# Patient Record
Sex: Male | Born: 1965 | Race: White | Hispanic: No | Marital: Single | Smoking: Former smoker
Health system: Southern US, Community
[De-identification: ages and names within clinical notes are randomized; demographics above are authoritative.]

## PROBLEM LIST (undated history)

## (undated) DIAGNOSIS — I10 Essential (primary) hypertension: Secondary | ICD-10-CM

## (undated) HISTORY — PX: NASAL SEPTUM SURGERY: SHX37

## (undated) HISTORY — PX: HERNIA REPAIR: SHX51

---

## 2014-01-28 ENCOUNTER — Encounter (HOSPITAL_COMMUNITY): Payer: Self-pay | Admitting: Emergency Medicine

## 2014-01-28 ENCOUNTER — Emergency Department (HOSPITAL_COMMUNITY)

## 2014-01-28 ENCOUNTER — Emergency Department (HOSPITAL_COMMUNITY)
Admission: EM | Admit: 2014-01-28 | Discharge: 2014-01-28 | Disposition: A | Discharge status: Home / Self Care | Attending: Emergency Medicine | Admitting: Emergency Medicine

## 2014-01-28 DIAGNOSIS — Y9389 Activity, other specified: Secondary | ICD-10-CM | POA: Insufficient documentation

## 2014-01-28 DIAGNOSIS — S8990XA Unspecified injury of unspecified lower leg, initial encounter: Secondary | ICD-10-CM | POA: Diagnosis present

## 2014-01-28 DIAGNOSIS — I10 Essential (primary) hypertension: Secondary | ICD-10-CM | POA: Insufficient documentation

## 2014-01-28 DIAGNOSIS — S99919A Unspecified injury of unspecified ankle, initial encounter: Secondary | ICD-10-CM | POA: Diagnosis present

## 2014-01-28 DIAGNOSIS — Y929 Unspecified place or not applicable: Secondary | ICD-10-CM | POA: Insufficient documentation

## 2014-01-28 DIAGNOSIS — X500XXA Overexertion from strenuous movement or load, initial encounter: Secondary | ICD-10-CM | POA: Insufficient documentation

## 2014-01-28 DIAGNOSIS — S93409A Sprain of unspecified ligament of unspecified ankle, initial encounter: Secondary | ICD-10-CM | POA: Insufficient documentation

## 2014-01-28 HISTORY — DX: Essential (primary) hypertension: I10

## 2014-01-28 MED ORDER — TRAMADOL HCL 50 MG PO TABS: 50.0000 mg | ORAL_TABLET | Freq: Four times a day (QID) | ORAL | Status: DC | PRN

## 2014-01-28 NOTE — ED Provider Notes (Signed)
CSN: [id number]     Arrival date & time 01/28/14  1230 History  This chart was scribed for non-physician practitioner, Kerrie Buffalo, FNP,working with Donnetta Hutching, MD, by Karle Plumber, ED Scribe.  This patient was seen in room APFT21/APFT21 and the patient's care was started at 1:29 PM.  Chief Complaint  Patient presents with  . Ankle Pain   The history is provided by the patient. No language interpreter was used.   HPI Comments:  Peter Graham is a 48 y.o. male with h/o HTN who presents from prison to the Emergency Department complaining of moderate left ankle pain that started approximately one week ago. He states the pain started in his lower left leg but now is localized to the ankle. Pt states he feels like a rubber band has "popped" in his lower left leg on two different occasions recently. He reports associated swelling. He denies injury, fall, or trauma to the ankle. He denies ever injuring the left ankle before. He is ambulatory without issue.  Past Medical History  Diagnosis Date  . Hypertension    Past Surgical History  Procedure Laterality Date  . Hernia repair    . Nasal septum surgery     No family history on file. History  Substance Use Topics  . Smoking status: Never Smoker   . Smokeless tobacco: Not on file  . Alcohol Use: No    Review of Systems  Musculoskeletal: Positive for arthralgias (left ankle).  All other systems reviewed and are negative.   Allergies  Review of patient's allergies indicates no known allergies.  Home Medications   Prior to Admission medications   Medication Sig Start Date End Date Taking? Authorizing Provider  traMADol (ULTRAM) 50 MG tablet Take 1 tablet (50 mg total) by mouth every 6 (six) hours as needed. 01/28/14   Hope Orlene Och, NP   Triage Vitals: BP 155/88  Pulse 70  Temp(Src) 98.2 F (36.8 C)  Resp 20  Ht 6\' 1"  (1.854 m)  Wt 205 lb (92.987 kg)  BMI 27.05 kg/m2  SpO2 97% Physical Exam  Nursing note and vitals  reviewed. Constitutional: He is oriented to person, place, and time. He appears well-developed and well-nourished.  Eyes: EOM are normal.  Neck: Neck supple.  Cardiovascular:  Strong pedal pulses. Adequate circulation.  Pulmonary/Chest: Effort normal.  Abdominal: Soft. There is no tenderness.  Musculoskeletal: Normal range of motion.  Neurological: He is alert and oriented to person, place, and time. No cranial nerve deficit.  Good touch sensation.   Skin: Skin is warm and dry.  Pea-sized healing abrasion noted to LLE.  No swelling noted to left ankle. Pain over lateral aspect of left ankle. Pain with inversion and eversion. Normal achilles tendon. Ecchymosis noted to lateral aspect of left ankle. Lateral aspect of left foot large area of ecchymosis. Good strength of BLE.    ED Course  Procedures (including critical care time) DIAGNOSTIC STUDIES: Oxygen Saturation is 97% on RA, normal by my interpretation.   COORDINATION OF CARE: 1:36 PM- Will provide ankle brace. Advised pt to RICE ankle and will prescribe Tramadol. Pt states he has Ibuprofen 800 mg and advised him to continue taking that as well. Pt verbalizes understanding and agrees to plan.  Medications - No data to display Imaging Review Dg Ankle Complete Left  01/28/2014   CLINICAL DATA:  Lateral left ankle pain for 1 week, no known injury  EXAM: LEFT ANKLE COMPLETE - 3+ VIEW  COMPARISON:  None.  FINDINGS:  There is no evidence of fracture, dislocation, or joint effusion. 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 Soft tissues are unremarkable.  IMPRESSION: No acute findings   Electronically Signed   By: Esperanza Heiraymond  Rubner M.D.   On: 01/28/2014 13:22    MDM  48 y.o. male with pain in the left ankle x one week that has increased. Patient stable for discharge without neurovascular compromise. Will treat with pain medication, ice, elevation and ankle brace. Discussed with the patient and all questioned fully answered. He will return if any  problems arise.   I personally performed the services described in this documentation, which was scribed in my presence. The recorded information has been reviewed and is accurate.    [facility] Orlene OchM Neese, [place]NP 01/28/14 [phone]

## 2014-01-28 NOTE — Discharge Instructions (Signed)
Continue your ibuprofen and take the narcotic pain medication as directed. Wear the splint for support and comfort, apply ice, elevate and if symptoms worsen, follow up with Dr. Romeo Apple.

## 2014-01-28 NOTE — ED Notes (Signed)
Pt incarcerated at [facility] [employer] and c/o pain in left ankle approx 1 week ago.  Says denies any injury but ankle has popped twice.

## 2014-06-05 DIAGNOSIS — B009 Herpesviral infection, unspecified: Secondary | ICD-10-CM | POA: Insufficient documentation

## 2014-06-05 DIAGNOSIS — N400 Enlarged prostate without lower urinary tract symptoms: Secondary | ICD-10-CM | POA: Insufficient documentation

## 2014-06-05 DIAGNOSIS — N4821 Abscess of corpus cavernosum and penis: Secondary | ICD-10-CM | POA: Insufficient documentation

## 2014-06-05 DIAGNOSIS — I10 Essential (primary) hypertension: Secondary | ICD-10-CM | POA: Insufficient documentation

## 2014-06-05 DIAGNOSIS — N4822 Cellulitis of corpus cavernosum and penis: Secondary | ICD-10-CM | POA: Insufficient documentation

## 2014-06-09 DIAGNOSIS — L0390 Cellulitis, unspecified: Secondary | ICD-10-CM | POA: Insufficient documentation

## 2014-08-19 ENCOUNTER — Encounter (HOSPITAL_COMMUNITY): Payer: Self-pay | Admitting: Emergency Medicine

## 2014-08-19 ENCOUNTER — Emergency Department (HOSPITAL_COMMUNITY)
Admission: EM | Admit: 2014-08-19 | Discharge: 2014-08-19 | Disposition: A | Discharge status: Home / Self Care | Attending: Emergency Medicine | Admitting: Emergency Medicine

## 2014-08-19 DIAGNOSIS — L02415 Cutaneous abscess of right lower limb: Secondary | ICD-10-CM | POA: Diagnosis not present

## 2014-08-19 DIAGNOSIS — I10 Essential (primary) hypertension: Secondary | ICD-10-CM | POA: Insufficient documentation

## 2014-08-19 LAB — CBC WITH DIFFERENTIAL/PLATELET
BASOS PCT: 1 % (ref 0–1)
Basophils Absolute: 0 10*3/uL (ref 0.0–0.1)
Eosinophils Absolute: 0.2 10*3/uL (ref 0.0–0.7)
Eosinophils Relative: 4 % (ref 0–5)
HCT: 36.9 % — ABNORMAL LOW (ref 39.0–52.0)
HEMOGLOBIN: 12.8 g/dL — AB (ref 13.0–17.0)
Lymphocytes Relative: 36 % (ref 12–46)
Lymphs Abs: 1.8 10*3/uL (ref 0.7–4.0)
MCH: 32.9 pg (ref 26.0–34.0)
MCHC: 34.7 g/dL (ref 30.0–36.0)
MCV: 94.9 fL (ref 78.0–100.0)
MONOS PCT: 8 % (ref 3–12)
Monocytes Absolute: 0.4 10*3/uL (ref 0.1–1.0)
NEUTROS ABS: 2.6 10*3/uL (ref 1.7–7.7)
Neutrophils Relative %: 51 % (ref 43–77)
Platelets: 200 10*3/uL (ref 150–400)
RBC: 3.89 MIL/uL — ABNORMAL LOW (ref 4.22–5.81)
RDW: 12.8 % (ref 11.5–15.5)
WBC: 5 10*3/uL (ref 4.0–10.5)

## 2014-08-19 LAB — BASIC METABOLIC PANEL
Anion gap: 15 (ref 5–15)
BUN: 15 mg/dL (ref 6–23)
CHLORIDE: 102 meq/L (ref 96–112)
CO2: 22 mEq/L (ref 19–32)
Calcium: 9.3 mg/dL (ref 8.4–10.5)
Creatinine, Ser: 1.14 mg/dL (ref 0.50–1.35)
GFR calc non Af Amer: 74 mL/min — ABNORMAL LOW (ref >90)
GFR, EST AFRICAN AMERICAN: 86 mL/min — AB (ref >90)
Glucose, Bld: 92 mg/dL (ref 70–99)
POTASSIUM: 4 meq/L (ref 3.7–5.3)
Sodium: 139 mEq/L (ref 137–147)

## 2014-08-19 MED ORDER — LIDOCAINE-EPINEPHRINE (PF) 2 %-1:200000 IJ SOLN
10.0000 mL | Freq: Once | INTRAMUSCULAR | Status: DC
  Filled 2014-08-19: qty 20

## 2014-08-19 NOTE — ED Notes (Signed)
Patient is a [facility]  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  Patient c/o wound to right inner thigh and has been on Cleocin and Septra x 1 week.  Patient states medication is not working.  Patient c/o burning pain to site.

## 2014-08-19 NOTE — ED Provider Notes (Signed)
CSN: [id number]     Arrival date & time 08/19/14  0039 History   First MD Initiated Contact with Patient 08/19/14 0154     No chief complaint on file.    (Consider location/radiation/quality/duration/timing/severity/associated sxs/prior Treatment) HPI  Patient reports he started getting a "spot" on his right thigh about 9 days ago. States he's had MRSA infections at least 3 times in the last several months. He was last admitted about 6 weeks ago for a change in antibiotics however he does not know what they used. He denies any fever. He states he's feeling "weird" which means he is having hot flashes. He denies any draining. He states his scar starting to get increased redness. It started getting a black center 2-3 days ago. He has had nausea without vomiting. He reports he has been taking Cleocin and Septra for the past week. He denies any exposure to spiders.  PCP Prison Physician  Past Medical History  Diagnosis Date  . Hypertension    prostate problems History of abscesses   Past Surgical History  Procedure Laterality Date  . Hernia repair    . Nasal septum surgery     No family history on file. History  Substance Use Topics  . Smoking status: Never Smoker   . Smokeless tobacco: Not on file  . Alcohol Use: No   patient has been incarcerated for 9 years  Review of Systems  All other systems reviewed and are negative.     Allergies  Review of patient's allergies indicates no known allergies.  Home Medications   Prior to Admission medications   Medication Sig Start Date End Date Taking? Authorizing Provider  traMADol (ULTRAM) 50 MG tablet Take 1 tablet (50 mg total) by mouth every 6 (six) hours as needed. 01/28/14   Hope Orlene OchM Neese, NP   Vasotec Atenolol Zantac Cardura Cleocin Septra   BP 137/95 mmHg  Pulse 54  Temp(Src) 97.6 F (36.4 C) (Oral)  Resp 18  Ht 6\' 1"  (1.854 m)  Wt 210 lb (95.255 kg)  BMI 27.71 kg/m2  SpO2 100%  Vital signs normal except for  bradycardia  Physical Exam  Constitutional: He is oriented to person, place, and time. He appears well-developed and well-nourished.  Non-toxic appearance. He does not appear ill. No distress.  HENT:  Head: Normocephalic and atraumatic.  Right Ear: External ear normal.  Left Ear: External ear normal.  Nose: Nose normal. No mucosal edema or rhinorrhea.  Mouth/Throat: Mucous membranes are normal. No dental abscesses or uvula swelling.  Eyes: Conjunctivae and EOM are normal. Pupils are equal, round, and reactive to light.  Neck: Normal range of motion and full passive range of motion without pain. Neck supple.  Pulmonary/Chest: Effort normal. No respiratory distress. He has no rhonchi. He exhibits no crepitus.  Abdominal: Normal appearance.  Musculoskeletal: Normal range of motion. He exhibits edema and tenderness.  Moves all extremities well.  Patient has a roughly 5 cm area of redness surrounding a black raised area on his proximal medial right thigh that is consistent with an abscess/spider bite.  Neurological: He is alert and oriented to person, place, and time. He has normal strength. No cranial nerve deficit.  Skin: Skin is warm, dry and intact. No rash noted. No erythema. No pallor.  Psychiatric: He has a normal mood and affect. His speech is normal and behavior is normal. His mood appears not anxious.  Nursing note and vitals reviewed.  ED Course  Procedures (including critical care time)   INCISION AND DRAINAGE Performed by: Devoria AlbeKNAPP,Redell Nazir L Consent: Verbal consent obtained. Risks and benefits: risks, benefits and alternatives were discussed Type: abscess  Body area: Medial right thigh  Anesthesia: local infiltration  Incision was made with a 11 scalpel.  Local anesthetic: lidocaine 1%+ 1%  epinephrine  Anesthetic total: 2 ml  Complexity: complex Blunt dissection to break up loculations  Drainage: purulent  Drainage amount: small, the black necrotic  center was removed   Packing material: 1/4 in iodoform gauze  Patient tolerance: Patient tolerated the procedure well with no immediate complications.    Labs Review Results for orders placed or performed during the hospital encounter of 08/19/14  CBC with Differential  Result Value Ref Range   WBC 5.0 4.0 - 10.5 K/uL   RBC 3.89 (L) 4.22 - 5.81 MIL/uL   Hemoglobin 12.8 (L) 13.0 - 17.0 g/dL   HCT 16.136.9 (L) 09.639.0 - 04.552.0 %   MCV 94.9 78.0 - 100.0 fL   MCH 32.9 26.0 - 34.0 pg   MCHC 34.7 30.0 - 36.0 g/dL   RDW 40.912.8 81.111.5 - 91.415.5 %   Platelets 200 150 - 400 K/uL   Neutrophils Relative % 51 43 - 77 %   Neutro Abs 2.6 1.7 - 7.7 K/uL   Lymphocytes Relative 36 12 - 46 %   Lymphs Abs 1.8 0.7 - 4.0 K/uL   Monocytes Relative 8 3 - 12 %   Monocytes Absolute 0.4 0.1 - 1.0 K/uL   Eosinophils Relative 4 0 - 5 %   Eosinophils Absolute 0.2 0.0 - 0.7 K/uL   Basophils Relative 1 0 - 1 %   Basophils Absolute 0.0 0.0 - 0.1 K/uL  Basic metabolic panel  Result Value Ref Range   Sodium 139 137 - 147 mEq/L   Potassium 4.0 3.7 - 5.3 mEq/L   Chloride 102 96 - 112 mEq/L   CO2 22 19 - 32 mEq/L   Glucose, Bld 92 70 - 99 mg/dL   BUN 15 6 - 23 mg/dL   Creatinine, Ser 7.821.14 0.50 - 1.35 mg/dL   Calcium 9.3 8.4 - 95.610.5 mg/dL   GFR calc non Af Amer 74 (L) >90 mL/min   GFR calc Af Amer 86 (L) >90 mL/min   Anion gap 15 5 - 15   No results found.    Imaging Review No results found.   EKG Interpretation None      MDM   patient presents with an abscess on his right medial thigh. He is on appropriate antibiotics. The area was I and D'd which should help his symptoms improved. The black necrotic center makes me suspicious this may have been a spider bite. However the treatment would be the same. Patient has a normal white blood cell count and he is afebrile. At this point I do not feel like he needs admission for IV antibiotics.    Final diagnoses:  Abscess of right thigh    Plan discharge  Devoria AlbeIva Jamiracle Avants,  MD, Franz DellFACEP     Jerret Mcbane L Valentino Saavedra, MD 08/19/14 [phone]

## 2014-08-19 NOTE — Discharge Instructions (Signed)
Use warm compresses on the area. He can have ibuprofen 600 mg + acetaminophen 1000 mg 4 times a day for pain if needed. He needs to stay on the antibiotics for another 7-10 days. The packing may fall out and that is okay, if it doesn't on it's own it can be removed in 2 days. Your blood tests today are normal with a normal WBC count. Recheck if you get a fever, or the redness spreads dramatically over the next 24-48 hrs. I would expect it to have some mild increase of the redness.

## 2014-08-19 NOTE — ED Notes (Signed)
Report given to Jasmine DecemberSharon, RN triage nurse at corrections facility

## 2014-08-22 ENCOUNTER — Telehealth (HOSPITAL_BASED_OUTPATIENT_CLINIC_OR_DEPARTMENT_OTHER): Payer: Self-pay | Admitting: Emergency Medicine

## 2014-08-22 LAB — WOUND CULTURE

## 2014-08-22 NOTE — Telephone Encounter (Signed)
Pt for MRSA, being treated for MRSA when she visited the ED, pt is also a prisoner therefore not called, pt continuing antibiotics

## 2015-02-06 ENCOUNTER — Emergency Department (HOSPITAL_COMMUNITY)
Admission: EM | Admit: 2015-02-06 | Discharge: 2015-02-06 | Disposition: A | Discharge status: Home / Self Care | Attending: Emergency Medicine | Admitting: Emergency Medicine

## 2015-02-06 ENCOUNTER — Encounter (HOSPITAL_COMMUNITY): Payer: Self-pay | Admitting: Emergency Medicine

## 2015-02-06 DIAGNOSIS — I10 Essential (primary) hypertension: Secondary | ICD-10-CM | POA: Diagnosis not present

## 2015-02-06 DIAGNOSIS — R21 Rash and other nonspecific skin eruption: Secondary | ICD-10-CM | POA: Diagnosis present

## 2015-02-06 DIAGNOSIS — J340 Abscess, furuncle and carbuncle of nose: Secondary | ICD-10-CM | POA: Diagnosis not present

## 2015-02-06 DIAGNOSIS — Z79899 Other long term (current) drug therapy: Secondary | ICD-10-CM | POA: Diagnosis not present

## 2015-02-06 MED ORDER — HYDROMORPHONE HCL 1 MG/ML IJ SOLN
1.0000 mg | Freq: Once | INTRAMUSCULAR | Status: AC
  Administered 2015-02-06: 1 mg via INTRAVENOUS
  Filled 2015-02-06: qty 1

## 2015-02-06 MED ORDER — DOXYCYCLINE HYCLATE 100 MG PO CAPS: 100.0000 mg | ORAL_CAPSULE | Freq: Two times a day (BID) | ORAL | Status: DC

## 2015-02-06 MED ORDER — VANCOMYCIN HCL IN DEXTROSE 1-5 GM/200ML-% IV SOLN
1000.0000 mg | Freq: Once | INTRAVENOUS | Status: AC
  Administered 2015-02-06: 1000 mg via INTRAVENOUS
  Filled 2015-02-06: qty 200

## 2015-02-06 MED ORDER — LIDOCAINE-EPINEPHRINE (PF) 2 %-1:200000 IJ SOLN
10.0000 mL | Freq: Once | INTRAMUSCULAR | Status: AC
  Administered 2015-02-06: 10 mL
  Filled 2015-02-06: qty 20

## 2015-02-06 MED ORDER — OXYCODONE-ACETAMINOPHEN 5-325 MG PO TABS: 1.0000 | ORAL_TABLET | ORAL | Status: DC | PRN

## 2015-02-06 MED ORDER — MUPIROCIN CALCIUM 2 % NA OINT: TOPICAL_OINTMENT | NASAL | Status: DC

## 2015-02-06 NOTE — Discharge Instructions (Signed)
Abscess °An abscess is an infected area that contains a collection of pus and debris. It can occur in almost any part of the body. An abscess is also known as a furuncle or boil. °CAUSES  °An abscess occurs when tissue gets infected. This can occur from blockage of oil or sweat glands, infection of hair follicles, or a minor injury to the skin. As the body tries to fight the infection, pus collects in the area and creates pressure under the skin. This pressure causes pain. People with weakened immune systems have difficulty fighting infections and get certain abscesses more often.  °SYMPTOMS °Usually an abscess develops on the skin and becomes a painful mass that is red, warm, and tender. If the abscess forms under the skin, you may feel a moveable soft area under the skin. Some abscesses break open (rupture) on their own, but most will continue to get worse without care. The infection can spread deeper into the body and eventually into the bloodstream, causing you to feel ill.  °DIAGNOSIS  °Your caregiver will take your medical history and perform a physical exam. A sample of fluid may also be taken from the abscess to determine what is causing your infection. °TREATMENT  °Your caregiver may prescribe antibiotic medicines to fight the infection. However, taking antibiotics alone usually does not cure an abscess. Your caregiver may need to make a small cut (incision) in the abscess to drain the pus. In some cases, gauze is packed into the abscess to reduce pain and to continue draining the area. °HOME CARE INSTRUCTIONS  °· Only take over-the-counter or prescription medicines for pain, discomfort, or fever as directed by your caregiver. °· If you were prescribed antibiotics, take them as directed. Finish them even if you start to feel better. °· If gauze is used, follow your caregiver's directions for changing the gauze. °· To avoid spreading the infection: °· Keep your draining abscess covered with a  bandage. °· Wash your hands well. °· Do not share personal care items, towels, or whirlpools with others. °· Avoid skin contact with others. °· Keep your skin and clothes clean around the abscess. °· Keep all follow-up appointments as directed by your caregiver. °SEEK MEDICAL CARE IF:  °· You have increased pain, swelling, redness, fluid drainage, or bleeding. °· You have muscle aches, chills, or a general ill feeling. °· You have a fever. °MAKE SURE YOU:  °· Understand these instructions. °· Will watch your condition. °· Will get help right away if you are not doing well or get worse. °Document Released: 05/27/2005 Document Revised: 02/16/2012 Document Reviewed: 10/30/2011 °ExitCare® Patient Information ©2015 [employer]. This information is not intended to replace advice given to you by your health care provider. Make sure you discuss any questions you have with your health care provider. ° °Facial Infection °You have an infection of your face. This requires special attention to help prevent serious problems. Infections in facial wounds can cause poor healing and scars. They can also spread to deeper tissues, especially around the eye. Wound and dental infections can lead to sinusitis, infection of the eye socket, and even meningitis. Permanent damage to the skin, eye, and nervous system may result if facial infections are not treated properly. With severe infections, hospital care for IV antibiotic injections may be needed if they don't respond to oral antibiotics. °Antibiotics must be taken for the full course to insure the infection is eliminated. If the infection came from a bad tooth, it may have to be   extracted when the infection is under control. Warm compresses may be applied to reduce skin irritation and remove drainage. °You might need a tetanus shot now if: °· You cannot remember when your last tetanus shot was. °· You have never had a tetanus shot. °· The object that caused your wound was dirty. °If  you need a tetanus shot, and you decide not to get one, there is a rare chance of getting tetanus. Sickness from tetanus can be serious. If you got a tetanus shot, your arm may swell, get red and warm to the touch at the shot site. This is common and not a problem. °SEEK IMMEDIATE MEDICAL CARE IF:  °· You have increased swelling, redness, or trouble breathing. °· You have a severe headache, dizziness, nausea, or vomiting. °· You develop problems with your eyesight. °· You have a fever. °Document Released: 09/24/2004 Document Revised: 11/09/2011 Document Reviewed: 08/17/2005 °ExitCare® Patient Information ©2015 [employer]. This information is not intended to replace advice given to you by your health care provider. Make sure you discuss any questions you have with your health care provider. ° °

## 2015-02-06 NOTE — ED Notes (Signed)
Pt alert & oriented x4, stable gait. Patient given discharge instructions, paperwork & prescription(s). Patient  instructed to stop at the registration desk to finish any additional paperwork. Patient verbalized understanding. Pt left department w/ no further questions. 

## 2015-02-06 NOTE — ED Provider Notes (Signed)
CSN: [id number]     Arrival date & time 02/06/15  1478 History   First MD Initiated Contact with Patient 02/06/15 [phone]     Chief Complaint  Patient presents with  . Cyst     (Consider location/radiation/quality/duration/timing/severity/associated sxs/prior Treatment) The history is provided by the patient.   Peter Graham is a 49 y.o. male presenting with a four-day history of abscess to the right side of his nose.  He reports pain and swelling which is now affecting his right eyelid as well.  He has a history of MRSA infection.  He states there was a small amount of drainage from this wound site yesterday.  He endorses nausea which he suspects is secondary to pain, denies fevers or chills or other complaints.  He states that Cleocin in the past has not helped him with his infections, doxycycline has been effective, and has had wound culture performed at [facility] verifying doxycycline as a better anti-biotic for him.  He has taken Tylenol without relief of pain.     Past Medical History  Diagnosis Date  . Hypertension    Past Surgical History  Procedure Laterality Date  . Hernia repair    . Nasal septum surgery     History reviewed. No pertinent family history. History  Substance Use Topics  . Smoking status: Never Smoker   . Smokeless tobacco: Not on file  . Alcohol Use: No    Review of Systems  Constitutional: Negative for fever and chills.  Respiratory: Negative for shortness of breath and wheezing.   Gastrointestinal: Positive for nausea.  Skin: Positive for color change and wound.  Neurological: Negative for numbness.      Allergies  Review of patient's allergies indicates no known allergies.  Home Medications   Prior to Admission medications   Medication Sig Start Date End Date Taking? Authorizing Provider  atenolol (TENORMIN) 50 MG tablet Take 50 mg by mouth daily.    Historical Provider, MD  Doxazosin Mesylate (CARDURA PO) Take 6 mg by mouth at bedtime.     Historical Provider, MD  doxycycline (VIBRAMYCIN) 100 MG capsule Take 1 capsule (100 mg total) by mouth 2 (two) times daily. 02/06/15   Burgess Amor, PA-C  enalapril (VASOTEC) 10 MG tablet Take 10 mg by mouth daily.    Historical Provider, MD  mupirocin nasal ointment (BACTROBAN) 2 % Apply in each nostril daily for 10 days 02/06/15   Burgess Amor, PA-C  oxyCODONE-acetaminophen (PERCOCET/ROXICET) 5-325 MG per tablet Take 1 tablet by mouth every 4 (four) hours as needed. 02/06/15   Burgess Amor, PA-C  ranitidine (ZANTAC) 150 MG tablet Take 150 mg by mouth 2 (two) times daily.    Historical Provider, MD  traMADol (ULTRAM) 50 MG tablet Take 1 tablet (50 mg total) by mouth every 6 (six) hours as needed. 01/28/14   Hope Orlene Och, NP   BP 138/75 mmHg  Pulse 64  Temp(Src) 97.9 F (36.6 C) (Oral)  Resp 18  Ht  (1.854 m)  Wt 205 lb (92.987 kg)  BMI 27.05 kg/m2  SpO2 99% Physical Exam  Constitutional: He appears well-developed and well-nourished. No distress.  HENT:  Head: Normocephalic.  Nose: Sinus tenderness present.    Mouth/Throat: Uvula is midline, oropharynx is clear and moist and mucous membranes are normal.  Raised 1 cm induration with central eschar on right nasal bridge.  There is no spontaneous drainage.  He has slight.  Orbital edema without erythema right eyelid.  Eyes: Conjunctivae and EOM  are normal.  No pain with EOM's.  Neck: Neck supple.  Cardiovascular: Normal rate.   Pulmonary/Chest: Effort normal. He has no wheezes.  Musculoskeletal: Normal range of motion. He exhibits no edema.  Skin: Rash noted.    ED Course  Procedures (including critical care time)  INCISION AND DRAINAGE Performed by: Burgess AmorIDOL, [name] Consent: Verbal consent obtained. Risks and benefits: risks, benefits and alternatives were discussed Type: abscess  Body area: right nasal bridge  Anesthesia: local infiltration  Incision was made with a scalpel.  Local anesthetic: lidocaine 2% with  epinephrine  Anesthetic total: 0.5 ml  Complexity: complex Blunt dissection to break up loculations  Drainage: purulent  Drainage amount: small  Packing material: none  Patient tolerance: Patient tolerated the procedure well with no immediate complications.    Labs Review Labs Reviewed - No data to display  Imaging Review No results found.   EKG Interpretation None      MDM   Final diagnoses:  Abscess of nose    Patient advised to have a nurse recheck tomorrow at his present facility.  He was prescribed doxycycline, oxycodone for pain relief, also prescribed mupirocin nasal ointment.  He was given an IV dose of vancomycin while here to help minimize the risk of infection spreading to the periorbital area.  Patient tolerated the I&D procedure well.    Burgess AmorJulie Myliah Medel, PA-C 02/06/15 1109  Gilda Creasehristopher J Pollina, MD 02/07/15 [phone]

## 2015-02-06 NOTE — ED Notes (Signed)
Pt reports abscess to r side of nose since Saturday.  Reports pain and pressure to area and around eye.  Swelling noted under R eye.  Reports history of MRSA.

## 2015-02-06 NOTE — ED Notes (Addendum)
Pt reports "cyst" to right side of nose. Pt reports history of MRS infections. Pt reports nausea at this time.mild swelling and redness noted to bridge of nose and under right eye. nad noted.

## 2015-07-03 IMAGING — CR DG ANKLE COMPLETE 3+V*L*
3 series · 3 of 3 positions shown · non-contrast
Comparison: None.

CLINICAL DATA: Lateral left ankle pain for 1 week, no known injury

EXAM:
LEFT ANKLE COMPLETE - 3+ VIEW

[view not recorded (1 of 3)]
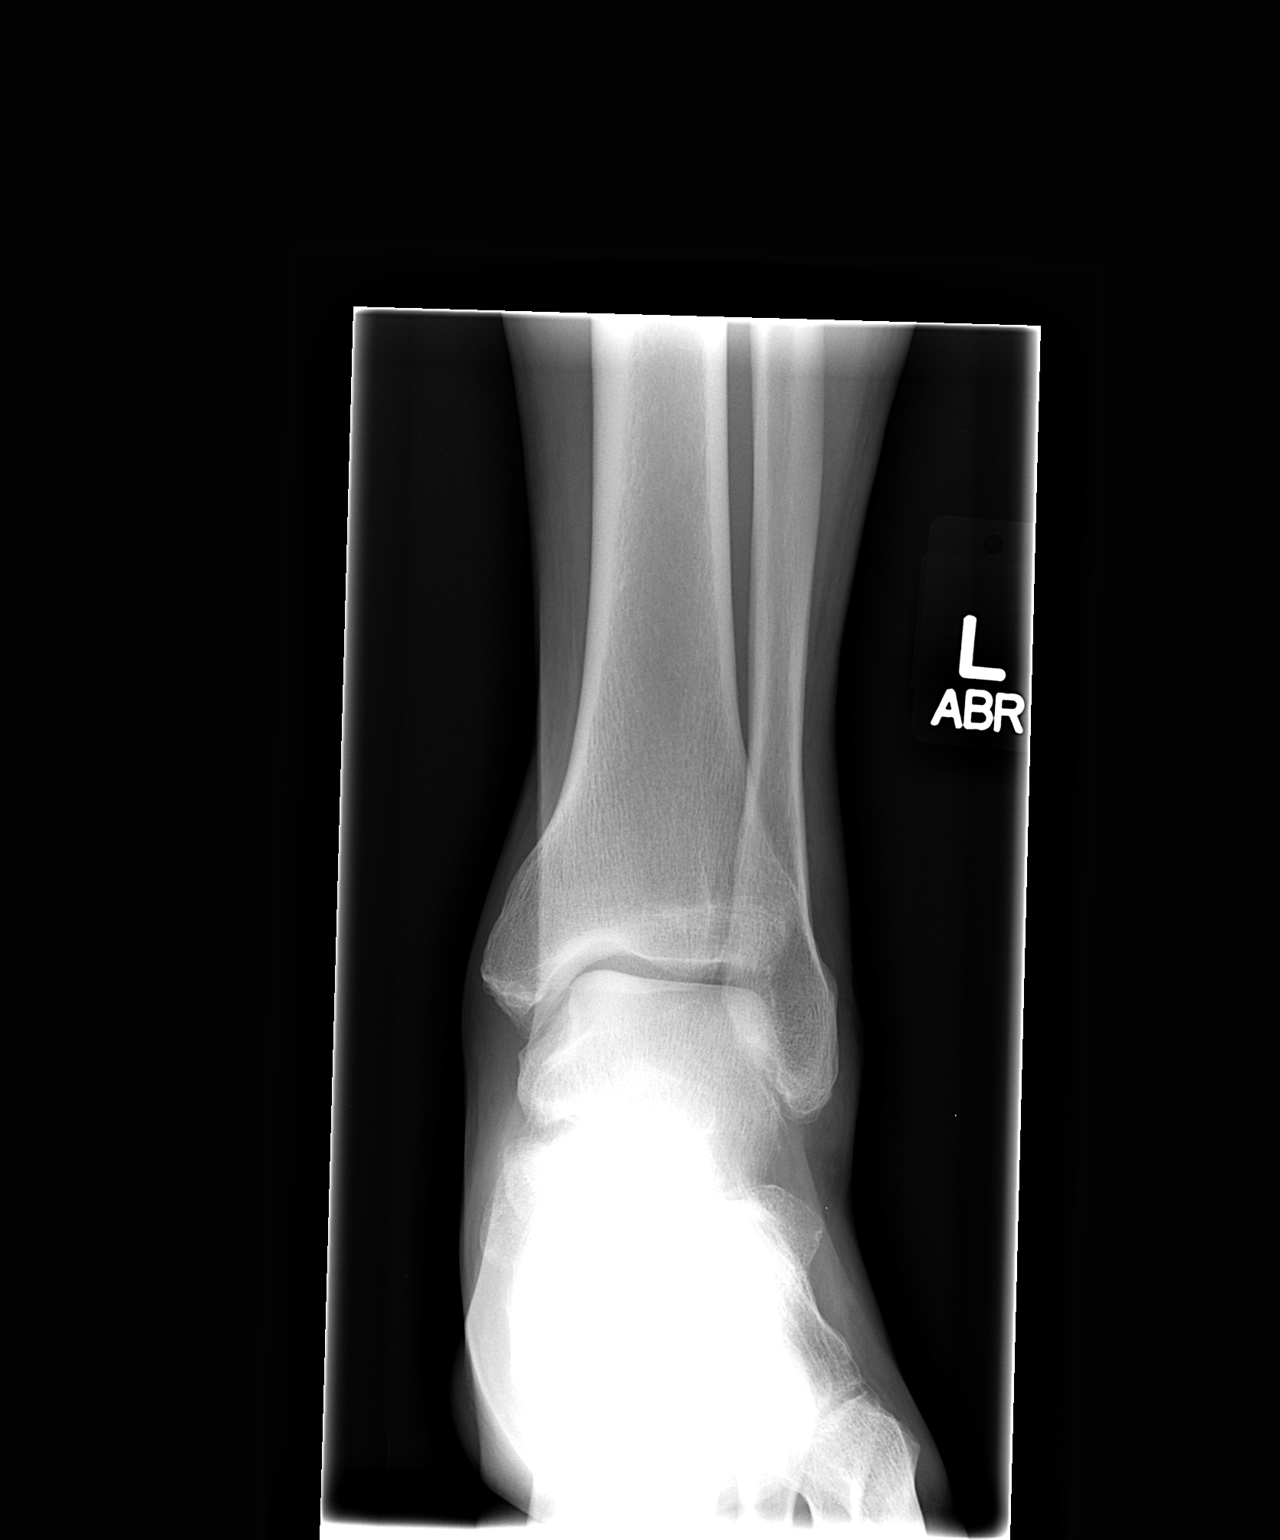

[view not recorded (2 of 3)]
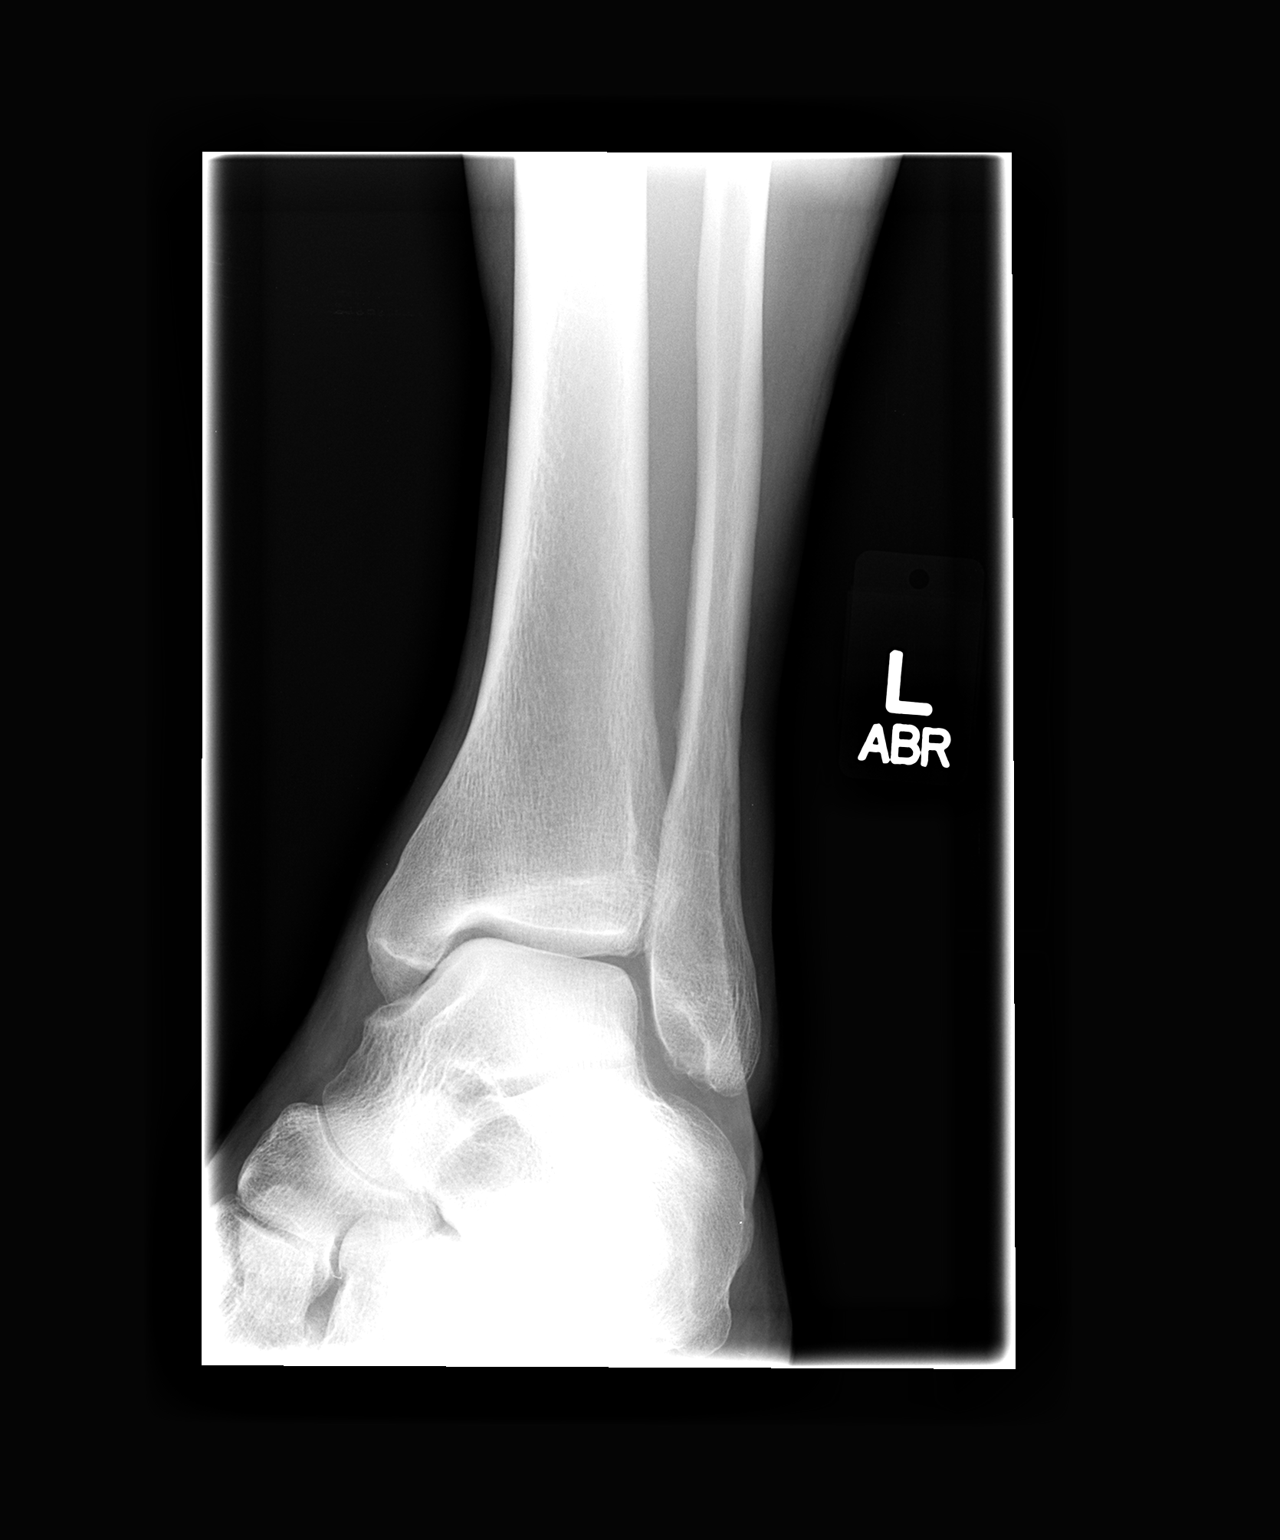

[view not recorded (3 of 3)]
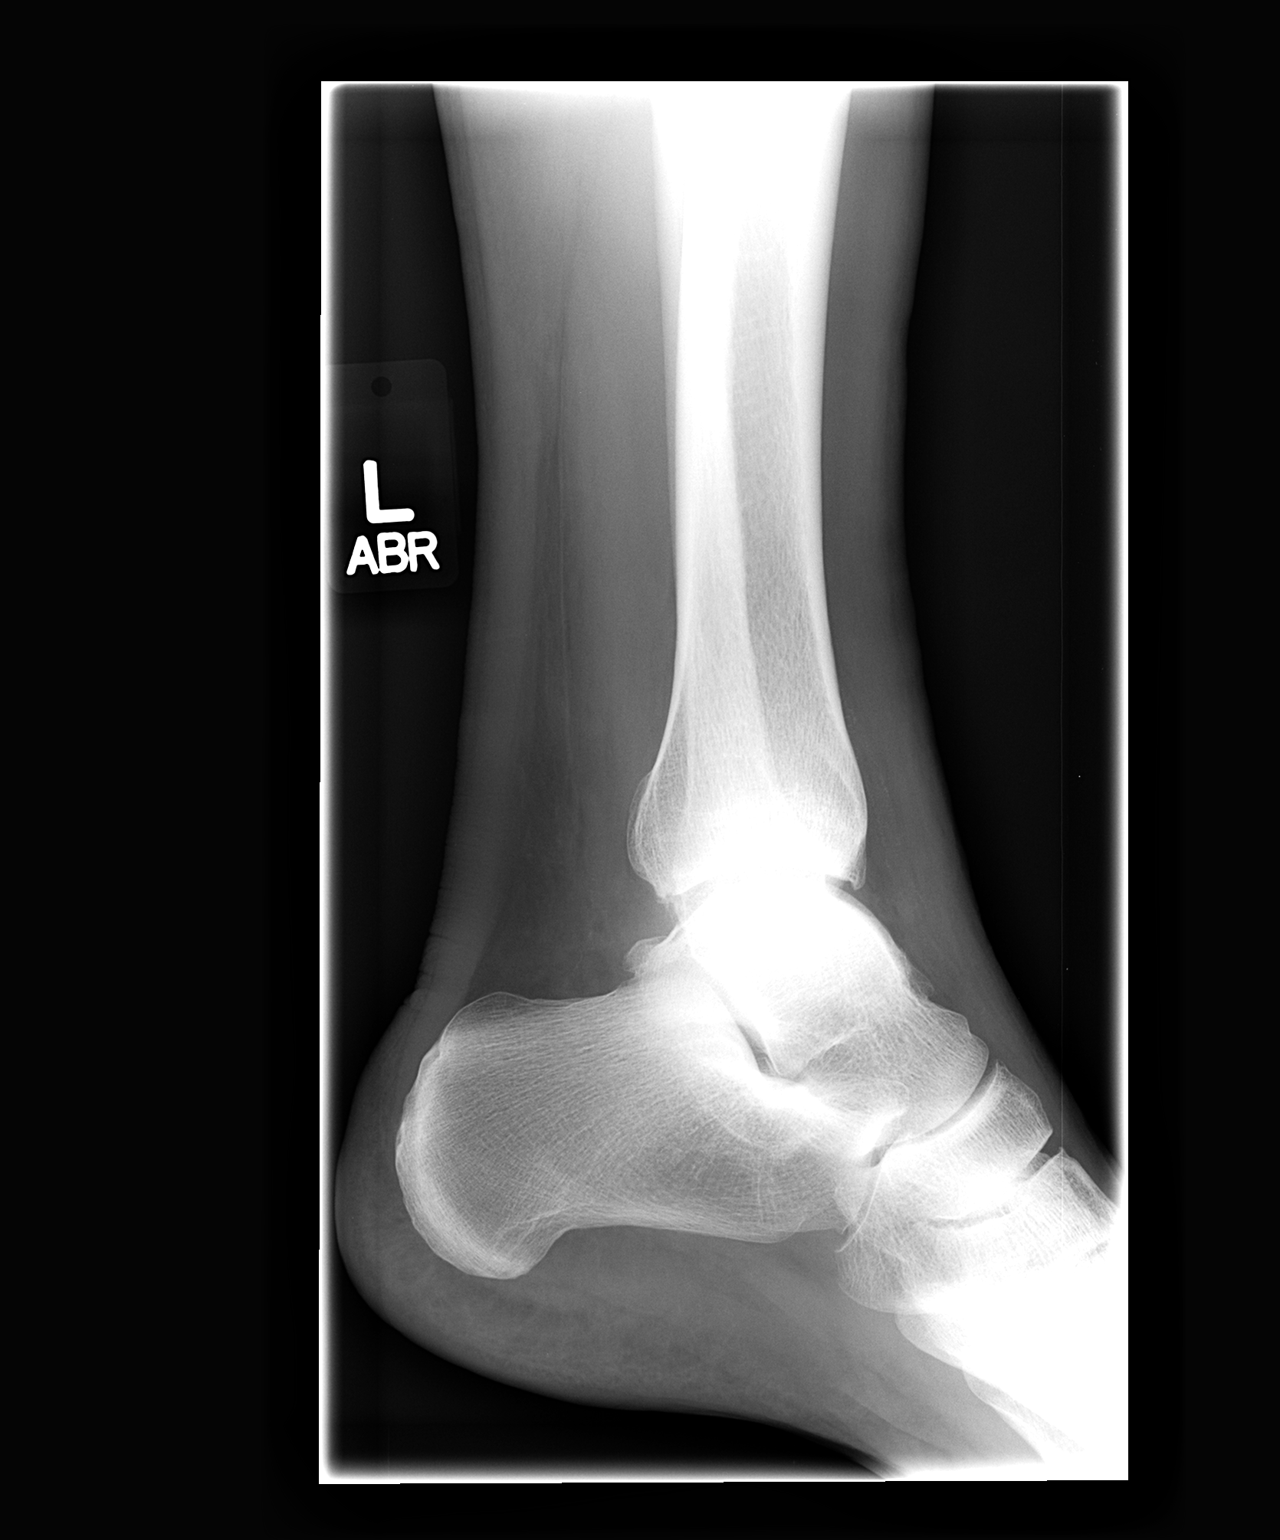

[3 of 3 positions shown; findings below may reference images not displayed]

FINDINGS: There is no evidence of fracture, dislocation, or joint effusion.
There is mild tibiotalar arthritis. Soft tissues are unremarkable.
IMPRESSION: No acute findings

## 2016-02-17 ENCOUNTER — Encounter (HOSPITAL_COMMUNITY): Payer: Self-pay | Admitting: *Deleted

## 2016-02-17 ENCOUNTER — Emergency Department (HOSPITAL_COMMUNITY)
Admission: EM | Admit: 2016-02-17 | Discharge: 2016-02-17 | Disposition: A | Discharge status: Home / Self Care | Attending: Emergency Medicine | Admitting: Emergency Medicine

## 2016-02-17 DIAGNOSIS — L0231 Cutaneous abscess of buttock: Secondary | ICD-10-CM | POA: Diagnosis present

## 2016-02-17 DIAGNOSIS — I10 Essential (primary) hypertension: Secondary | ICD-10-CM | POA: Insufficient documentation

## 2016-02-17 DIAGNOSIS — L03317 Cellulitis of buttock: Secondary | ICD-10-CM | POA: Insufficient documentation

## 2016-02-17 DIAGNOSIS — L0291 Cutaneous abscess, unspecified: Secondary | ICD-10-CM

## 2016-02-17 DIAGNOSIS — Z79899 Other long term (current) drug therapy: Secondary | ICD-10-CM | POA: Insufficient documentation

## 2016-02-17 DIAGNOSIS — L0390 Cellulitis, unspecified: Secondary | ICD-10-CM

## 2016-02-17 MED ORDER — OXYCODONE-ACETAMINOPHEN 5-325 MG PO TABS
1.0000 | ORAL_TABLET | Freq: Once | ORAL | Status: AC
  Administered 2016-02-17: 1 via ORAL
  Filled 2016-02-17: qty 1

## 2016-02-17 MED ORDER — DOXYCYCLINE HYCLATE 100 MG PO CAPS: 100.0000 mg | ORAL_CAPSULE | Freq: Two times a day (BID) | ORAL | Status: DC

## 2016-02-17 MED ORDER — LIDOCAINE-EPINEPHRINE (PF) 2 %-1:200000 IJ SOLN
20.0000 mL | Freq: Once | INTRAMUSCULAR | Status: AC
  Administered 2016-02-17: 20 mL
  Filled 2016-02-17: qty 20

## 2016-02-17 NOTE — ED Notes (Signed)
Report called and given to Molli HazardMatthew at the prison triage

## 2016-02-17 NOTE — Discharge Instructions (Signed)

## 2016-02-17 NOTE — ED Provider Notes (Signed)
CSN: [id number]     Arrival date & time 02/17/16  0109 History   First MD Initiated Contact with Patient 02/17/16 0321     Chief Complaint  Patient presents with  . Abscess     (Consider location/radiation/quality/duration/timing/severity/associated sxs/prior Treatment) HPI  This a 50 year old male with a history of MRSA skin infections who presents with an abscessed left buttock. Noted pain and redness to the left buttock starting on Thursday. No fevers. Reports 10 out of 10 pain when sitting. Has not taking anything for pain. Has not noted any drainage. 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  Past Medical History  Diagnosis Date  . Hypertension    Past Surgical History  Procedure Laterality Date  . Hernia repair    . Nasal septum surgery     No family history on file. Social History  Substance Use Topics  . Smoking status: Never Smoker   . Smokeless tobacco: None  . Alcohol Use: No    Review of Systems  Constitutional: Negative for fever.  Skin: Positive for color change.  All other systems reviewed and are negative.     Allergies  Review of patient's allergies indicates no known allergies.  Home Medications   Prior to Admission medications   Medication Sig Start Date End Date Taking? Authorizing Provider  Doxazosin Mesylate (CARDURA PO) Take 6 mg by mouth at bedtime.   Yes Historical Provider, MD  enalapril (VASOTEC) 10 MG tablet Take 10 mg by mouth daily.   Yes Historical Provider, MD  ibuprofen (ADVIL,MOTRIN) 800 MG tablet Take 800 mg by mouth daily as needed for moderate pain.   Yes Historical Provider, MD  omeprazole (PRILOSEC) 20 MG capsule Take 20 mg by mouth daily.   Yes Historical Provider, MD  atenolol (TENORMIN) 50 MG tablet Take 50 mg by mouth daily.    Historical Provider, MD  doxycycline (VIBRAMYCIN) 100 MG capsule Take 1 capsule (100 mg total) by mouth 2 (two) times daily. 02/17/16   Shon Batonourtney F  Horton, MD  gemfibrozil (LOPID) 600 MG tablet Take 600 mg by mouth 2 (two) times daily before a meal.    Historical Provider, MD  mupirocin nasal ointment (BACTROBAN) 2 % Apply in each nostril daily for 10 days 02/06/15   Burgess AmorJulie Idol, PA-C  oxyCODONE-acetaminophen (PERCOCET/ROXICET) 5-325 MG per tablet Take 1 tablet by mouth every 4 (four) hours as needed. 02/06/15   Burgess AmorJulie Idol, PA-C  ranitidine (ZANTAC) 150 MG tablet Take 150 mg by mouth 2 (two) times daily.    Historical Provider, MD   BP 147/78 mmHg  Pulse 96  Temp(Src) 99.4 F (37.4 C) (Oral)  Resp 20  Ht 6\' 1"  (1.854 m)  Wt 200 lb (90.719 kg)  BMI 26.39 kg/m2  SpO2 100% Physical Exam  Constitutional: He is oriented to person, place, and time. He appears well-developed and well-nourished.  HENT:  Head: Normocephalic and atraumatic.  Cardiovascular: Normal rate and regular rhythm.   Pulmonary/Chest: Effort normal. No respiratory distress.  Musculoskeletal: He exhibits no edema.  Neurological: He is alert and oriented to person, place, and time.  Skin: Skin is warm and dry.  4 cm circumferential area of induration and erythema noted over the left lower buttock, no spontaneous drainage noted  Psychiatric: He has a normal mood and affect.  Nursing note and vitals reviewed.   ED Course  Procedures (including critical care time)  INCISION AND DRAINAGE Performed by: Shon BatonHORTON, COURTNEY F Consent: Verbal consent obtained.  Risks and benefits: risks, benefits and alternatives were discussed Type: abscess  Body area: buttock  Anesthesia: local infiltration  Incision was made with a scalpel.  Local anesthetic: lidocaine 1% w epinephrine  Anesthetic total: 8 ml  Complexity: complex Blunt dissection to break up loculations  Drainage: purulent  Drainage amount: scant  Packing material: 1/2 in iodoform gauze  Patient tolerance: Patient tolerated the procedure well with no immediate complications.    Labs Review Labs Reviewed  - No data to display  Imaging Review No results found. I have personally reviewed and evaluated these images and lab results as part of my medical decision-making.   EKG Interpretation None      MDM   Final diagnoses:  Abscess and cellulitis    Patient presents with abscess and cellulitis to the left buttock. Scant drainage on incision and drainage. Not systemically ill appearing. We'll place on doxycycline.  After history, exam, and medical workup I feel the patient has been appropriately medically screened and is safe for discharge home. Pertinent diagnoses were discussed with the patient. Patient was given return precautions.     Shon Baton, MD 02/17/16 [phone]

## 2016-02-17 NOTE — ED Notes (Signed)
Pt c/o abscess to left buttock region that started Thursday,

## 2017-08-03 DIAGNOSIS — M79672 Pain in left foot: Secondary | ICD-10-CM | POA: Insufficient documentation

## 2017-08-25 DIAGNOSIS — F419 Anxiety disorder, unspecified: Secondary | ICD-10-CM | POA: Insufficient documentation

## 2017-10-25 DIAGNOSIS — M76892 Other specified enthesopathies of left lower limb, excluding foot: Secondary | ICD-10-CM | POA: Insufficient documentation

## 2017-10-26 DIAGNOSIS — M25462 Effusion, left knee: Secondary | ICD-10-CM | POA: Insufficient documentation

## 2017-11-20 DIAGNOSIS — L02512 Cutaneous abscess of left hand: Secondary | ICD-10-CM | POA: Insufficient documentation

## 2018-11-02 DIAGNOSIS — G894 Chronic pain syndrome: Secondary | ICD-10-CM | POA: Insufficient documentation

## 2021-05-08 ENCOUNTER — Other Ambulatory Visit: Payer: Self-pay

## 2021-05-08 ENCOUNTER — Emergency Department (HOSPITAL_COMMUNITY)
Admission: EM | Admit: 2021-05-08 | Discharge: 2021-05-08 | Disposition: A | Discharge status: Home / Self Care | Payer: Medicaid Other | Attending: Emergency Medicine | Admitting: Emergency Medicine

## 2021-05-08 ENCOUNTER — Emergency Department (HOSPITAL_COMMUNITY): Payer: Medicaid Other

## 2021-05-08 ENCOUNTER — Encounter (HOSPITAL_COMMUNITY): Payer: Self-pay | Admitting: Emergency Medicine

## 2021-05-08 DIAGNOSIS — I10 Essential (primary) hypertension: Secondary | ICD-10-CM | POA: Insufficient documentation

## 2021-05-08 DIAGNOSIS — Z79899 Other long term (current) drug therapy: Secondary | ICD-10-CM | POA: Diagnosis not present

## 2021-05-08 DIAGNOSIS — S0990XA Unspecified injury of head, initial encounter: Secondary | ICD-10-CM | POA: Insufficient documentation

## 2021-05-08 DIAGNOSIS — W208XXA Other cause of strike by thrown, projected or falling object, initial encounter: Secondary | ICD-10-CM | POA: Insufficient documentation

## 2021-05-08 NOTE — ED Provider Notes (Signed)
[facility]-EMERGENCY DEPT Provider Note   CSN: [id number] Arrival date & time: 05/08/21  1117     History Chief Complaint  Patient presents with   Head Injury    Peter Graham is a 55 y.o. male.  Patient states a heavy metal box fell on his head a couple days ago and he is complaining of a headache.  He did not lose consciousness.  The history is provided by the patient and medical records. No language interpreter was used.  Head Injury Location:  Occipital Mechanism of injury comment:  Large metal object fell on his head Pain details:    Quality:  Aching   Radiates to: Back with head.   Severity:  Moderate   Timing:  Constant   Progression:  Waxing and waning Chronicity:  New Relieved by:  Nothing Worsened by:  Nothing Ineffective treatments:  None tried Associated symptoms: headache   Associated symptoms: no blurred vision and no seizures       Past Medical History:  Diagnosis Date   Hypertension     There are no problems to display for this patient.   Past Surgical History:  Procedure Laterality Date   HERNIA REPAIR     NASAL SEPTUM SURGERY         History reviewed. No pertinent family history.  Social History   Tobacco Use   Smoking status: Never  Substance Use Topics   Alcohol use: No   Drug use: No    Home Medications Prior to Admission medications   Medication Sig Start Date End Date Taking? Authorizing Provider  atenolol (TENORMIN) 50 MG tablet Take 50 mg by mouth daily.    [provider]  Doxazosin Mesylate (CARDURA PO) Take 6 mg by mouth at bedtime.    [provider]  doxycycline (VIBRAMYCIN) 100 MG capsule Take 1 capsule (100 mg total) by mouth 2 (two) times daily. 02/17/16   Horton, Mayer Masker, MD  enalapril (VASOTEC) 10 MG tablet Take 10 mg by mouth daily.    [provider]  gemfibrozil (LOPID) 600 MG tablet Take 600 mg by mouth 2 (two) times daily before a meal.    [provider]  ibuprofen (ADVIL,MOTRIN) 800 MG tablet Take 800 mg by mouth daily as needed for moderate pain.    [provider]  mupirocin nasal ointment (BACTROBAN) 2 % Apply in each nostril daily for 10 days 02/06/15   Burgess Amor, PA-C  omeprazole (PRILOSEC) 20 MG capsule Take 20 mg by mouth daily.    [provider]  oxyCODONE-acetaminophen (PERCOCET/ROXICET) 5-325 MG per tablet Take 1 tablet by mouth every 4 (four) hours as needed. 02/06/15   Burgess Amor, PA-C  ranitidine (ZANTAC) 150 MG tablet Take 150 mg by mouth 2 (two) times daily.    [provider]    Allergies    Patient has no known allergies.  Review of Systems   Review of Systems  Constitutional:  Negative for appetite change and fatigue.  HENT:  Negative for congestion, ear discharge and sinus pressure.   Eyes:  Negative for blurred vision and discharge.  Respiratory:  Negative for cough.   Cardiovascular:  Negative for chest pain.  Gastrointestinal:  Negative for abdominal pain and diarrhea.  Genitourinary:  Negative for frequency and hematuria.  Musculoskeletal:  Negative for back pain.  Skin:  Negative for rash.  Neurological:  Positive for headaches. Negative for seizures.  Psychiatric/Behavioral:  Negative for hallucinations.    Physical Exam  Updated Vital Signs BP (!) 156/82 (BP Location: Left Arm)   Pulse 74   Temp 97.8 F (36.6 C) (Oral)   Resp 16   Ht 6\' 1"  (1.854 m)   Wt 86.2 kg   SpO2 98%   BMI 25.07 kg/m   Physical Exam Vitals and nursing note reviewed.  Constitutional:      Appearance: He is well-developed.  HENT:     Head: Normocephalic.     Comments: Minor tenderness to occipital area    Nose: Nose normal.  Eyes:     General: No scleral icterus.    Conjunctiva/sclera: Conjunctivae normal.  Neck:     Thyroid: No thyromegaly.  Cardiovascular:     Rate and Rhythm: Normal rate and regular rhythm.     Heart sounds: No murmur heard.   No friction rub. No gallop.   Pulmonary:     Breath sounds: No stridor. No wheezing or rales.  Chest:     Chest wall: No tenderness.  Abdominal:     General: There is no distension.     Tenderness: There is no abdominal tenderness. There is no rebound.  Musculoskeletal:        General: Normal range of motion.     Cervical back: Neck supple.  Lymphadenopathy:     Cervical: No cervical adenopathy.  Skin:    Findings: No erythema or rash.  Neurological:     Mental Status: He is oriented to person, place, and time.     Motor: No abnormal muscle tone.     Coordination: Coordination normal.  Psychiatric:        Behavior: Behavior normal.    ED Results / Procedures / Treatments   Labs (all labs ordered are listed, but only abnormal results are displayed) Labs Reviewed - No data to display  EKG None  Radiology CT HEAD WO CONTRAST ( )  Result Date: 05/08/2021 CLINICAL DATA:  Metal box fell on head 2 days ago. Headache and left-sided neck pain. EXAM: CT HEAD WITHOUT CONTRAST CT CERVICAL SPINE WITHOUT CONTRAST TECHNIQUE: Multidetector CT imaging of the head and cervical spine was performed following the standard protocol without intravenous contrast. Multiplanar CT image reconstructions of the cervical spine were also generated. COMPARISON:  None. FINDINGS: CT HEAD FINDINGS Brain: No evidence of acute infarction, hemorrhage, hydrocephalus, extra-axial collection or mass effect. Ovoid calcified lesion overlying the right frontal lobe measuring 8 x 4 mm is favored to be a meningioma. Vascular: No hyperdense vessel or unexpected calcification. Skull: Normal. Negative for fracture or focal lesion. Sinuses/Orbits: No acute finding. Other: None. CT CERVICAL SPINE FINDINGS Alignment: Within normal limits. Skull base and vertebrae: No acute fracture. No primary bone lesion or focal pathologic process. Soft tissues and spinal canal: No prevertebral fluid or swelling. No visible canal hematoma. Disc levels: Degenerative changes  seen throughout the cervical spine with greatest disease burden at C5-C6 with posterior spondylotic ridge causing severe bilateral neural foraminal stenosis and effacement of the ventral thecal sac. Upper chest: Negative. Other: None. IMPRESSION: 1. No acute intracranial abnormality. 2. No acute abnormality of the cervical or visualized upper thoracic spine. Electronically Signed   By: 07/08/2021 M.D.   On: 05/08/2021 13:21   CT Cervical Spine Wo Contrast  Result Date: 05/08/2021 CLINICAL DATA:  Metal box fell on head 2 days ago. Headache and left-sided neck pain. EXAM: CT HEAD WITHOUT CONTRAST CT CERVICAL SPINE WITHOUT CONTRAST TECHNIQUE: Multidetector CT imaging of the head and cervical spine was performed following the  standard protocol without intravenous contrast. Multiplanar CT image reconstructions of the cervical spine were also generated. COMPARISON:  None. FINDINGS: CT HEAD FINDINGS Brain: No evidence of acute infarction, hemorrhage, hydrocephalus, extra-axial collection or mass effect. Ovoid calcified lesion overlying the right frontal lobe measuring 8 x 4 mm is favored to be a meningioma. Vascular: No hyperdense vessel or unexpected calcification. Skull: Normal. Negative for fracture or focal lesion. Sinuses/Orbits: No acute finding. Other: None. CT CERVICAL SPINE FINDINGS Alignment: Within normal limits. Skull base and vertebrae: No acute fracture. No primary bone lesion or focal pathologic process. Soft tissues and spinal canal: No prevertebral fluid or swelling. No visible canal hematoma. Disc levels: Degenerative changes seen throughout the cervical spine with greatest disease burden at C5-C6 with posterior spondylotic ridge causing severe bilateral neural foraminal stenosis and effacement of the ventral thecal sac. Upper chest: Negative. Other: None. IMPRESSION: 1. No acute intracranial abnormality. 2. No acute abnormality of the cervical or visualized upper thoracic spine. Electronically  Signed   By: Acquanetta Belling M.D.   On: 05/08/2021 13:21    Procedures Procedures   Medications Ordered in ED Medications - No data to display  ED Course  I have reviewed the triage vital signs and the nursing notes.  Pertinent labs & imaging results that were available during my care of the patient were reviewed by me and considered in my medical decision making (see chart for details).    MDM Rules/Calculators/A&P                           Patient with head injury and no trauma related injury seen on CT.  Patient has these small calcified lesion in his frontal lobe.  I told the patient that I wanted to speak with neurosurgery to see if there is any reason for him to follow-up as an outpatient for this lesion.  The patient did not want to wait around for me to speak to the neurosurgeon.  He stated that he would just follow-up himself so we have given him referrals to Dr. Lovell Sheehan who is on-call Final Clinical Impression(s) / ED Diagnoses Final diagnoses:  Injury of head, initial encounter    Rx / DC Orders ED Discharge Orders     None        Bethann Berkshire, MD 05/08/21 1347

## 2021-05-08 NOTE — Discharge Instructions (Addendum)
Follow-up with Dr. Lovell Sheehan to discuss findings on your CT scan and for follow-up on your injury.

## 2021-05-08 NOTE — ED Triage Notes (Signed)
Pt states that he had a metal box fall on his head 2 days ago and is now having headaches and L sided neck pain. Alert and oriented. No LOC.

## 2021-05-08 NOTE — ED Notes (Signed)
ED Provider at bedside. 

## 2021-07-07 DIAGNOSIS — F29 Unspecified psychosis not due to a substance or known physiological condition: Secondary | ICD-10-CM | POA: Insufficient documentation

## 2021-07-08 DIAGNOSIS — F1510 Other stimulant abuse, uncomplicated: Secondary | ICD-10-CM | POA: Insufficient documentation

## 2021-07-10 DIAGNOSIS — F1320 Sedative, hypnotic or anxiolytic dependence, uncomplicated: Secondary | ICD-10-CM | POA: Insufficient documentation

## 2021-07-10 DIAGNOSIS — F17200 Nicotine dependence, unspecified, uncomplicated: Secondary | ICD-10-CM | POA: Insufficient documentation

## 2021-07-10 DIAGNOSIS — F1120 Opioid dependence, uncomplicated: Secondary | ICD-10-CM | POA: Insufficient documentation

## 2021-07-10 DIAGNOSIS — F1994 Other psychoactive substance use, unspecified with psychoactive substance-induced mood disorder: Secondary | ICD-10-CM | POA: Insufficient documentation

## 2021-07-19 ENCOUNTER — Emergency Department (INDEPENDENT_AMBULATORY_CARE_PROVIDER_SITE_OTHER)
Admission: EM | Admit: 2021-07-19 | Discharge: 2021-07-19 | Disposition: A | Discharge status: Other Acute Inpatient Hospital | Payer: Medicaid Other | Source: Home / Self Care

## 2021-07-19 ENCOUNTER — Other Ambulatory Visit: Payer: Self-pay

## 2021-07-19 DIAGNOSIS — M7989 Other specified soft tissue disorders: Secondary | ICD-10-CM | POA: Diagnosis not present

## 2021-07-19 NOTE — ED Triage Notes (Signed)
Pt states that he has some swelling of his right arm x3 weeks  Pt states that he is an iv drug user.

## 2021-07-19 NOTE — Discharge Instructions (Addendum)
Advised/instructed patient to go to [facility] ED [facility] now for further evaluation of bilateral lower arm swelling and to rule out embolism given recent IV drug use.  Patient agreed and verbalized understanding of these instructions and this plan of care today.  Patient reports that his Father will be driving him to the ED now.

## 2021-07-19 NOTE — ED Provider Notes (Signed)
[name]    CSN: [id number] Arrival date & time: 07/19/21  1535      History   Chief Complaint Chief Complaint  Patient presents with   Arm Swelling    Pt states that his right arm is swollen. X3 weeks    HPI Peter Graham is a 55 y.o. male.   HPI 55 year old male presents with right arm swelling for 3 weeks.  Patient reports that he is a current IV drug user.  Patient fell asleep briefly while being triaged/having blood pressure taken by CMA today.  Past Medical History:  Diagnosis Date   Hypertension     There are no problems to display for this patient.   Past Surgical History:  Procedure Laterality Date   HERNIA REPAIR     NASAL SEPTUM SURGERY         Home Medications    Prior to Admission medications   Medication Sig Start Date End Date Taking? Authorizing Provider  Doxazosin Mesylate (CARDURA PO) Take 6 mg by mouth at bedtime.   Yes [provider]  enalapril (VASOTEC) 10 MG tablet Take 10 mg by mouth daily.   Yes [provider]  gemfibrozil (LOPID) 600 MG tablet Take 600 mg by mouth 2 (two) times daily before a meal.   Yes [provider]  ibuprofen (ADVIL,MOTRIN) 800 MG tablet Take 800 mg by mouth daily as needed for moderate pain.   Yes [provider]  mupirocin nasal ointment (BACTROBAN) 2 % Apply in each nostril daily for 10 days 02/06/15  Yes Idol, Raynelle Fanning, PA-C  omeprazole (PRILOSEC) 20 MG capsule Take 20 mg by mouth daily.   Yes [provider]  oxyCODONE-acetaminophen (PERCOCET/ROXICET) 5-325 MG per tablet Take 1 tablet by mouth every 4 (four) hours as needed. 02/06/15  Yes Idol, Raynelle Fanning, PA-C  atenolol (TENORMIN) 50 MG tablet Take 50 mg by mouth daily.    [provider]  doxycycline (VIBRAMYCIN) 100 MG capsule Take 1 capsule (100 mg total) by mouth 2 (two) times daily. 02/17/16   Horton, Mayer Masker, MD  ranitidine (ZANTAC) 150 MG tablet Take 150 mg by mouth 2 (two) times daily.     [provider]    Family History History reviewed. No pertinent family history.  Social History Social History   Tobacco Use   Smoking status: Never  Substance Use Topics   Alcohol use: No   Drug use: Yes    Types: IV     Allergies   Patient has no known allergies.   Review of Systems Review of Systems  Musculoskeletal:        Right arm swelling x3 weeks  All other systems reviewed and are negative.   Physical Exam Triage Vital Signs ED Triage Vitals [07/19/21 1545]  Enc Vitals Group     BP      Pulse      Resp      Temp      Temp src      SpO2      Weight 192 lb (87.1 kg)     Height 6\' 1"  (1.854 m)     Head Circumference      Peak Flow      Pain Score 7     Pain Loc      Pain Edu?      Excl. in GC?    No data found.  Updated Vital Signs BP 125/73 (BP Location: Left Arm)   Pulse 76  Temp 99.4 F (37.4 C) (Oral)   Resp 18   Ht 6\' 1"  (1.854 m)   Wt 192 lb (87.1 kg)   SpO2 96%   BMI 25.33 kg/m      Physical Exam Vitals and nursing note reviewed.  Constitutional:      Appearance: Normal appearance. He is normal weight.  HENT:     Head: Normocephalic and atraumatic.     Mouth/Throat:     Mouth: Mucous membranes are moist.     Pharynx: Oropharynx is clear.  Eyes:     Extraocular Movements: Extraocular movements intact.     Conjunctiva/sclera: Conjunctivae normal.     Pupils: Pupils are equal, round, and reactive to light.  Cardiovascular:     Rate and Rhythm: Normal rate and regular rhythm.     Pulses: Normal pulses.     Heart sounds: Normal heart sounds.  Pulmonary:     Effort: Pulmonary effort is normal.     Breath sounds: Normal breath sounds.  Musculoskeletal:     Cervical back: Normal range of motion and neck supple.     Comments: Bilateral lower arm swelling noted  Skin:    General: Skin is warm and dry.  Neurological:     General: No focal deficit present.     Mental Status: He is alert and oriented to person,  place, and time.     UC Treatments / Results  Labs (all labs ordered are listed, but only abnormal results are displayed) Labs Reviewed - No data to display  EKG   Radiology No results found.  Procedures Procedures (including critical care time)  Medications Ordered in UC Medications - No data to display  Initial Impression / Assessment and Plan / UC Course  I have reviewed the triage vital signs and the nursing notes.  Pertinent labs & imaging results that were available during my care of the patient were reviewed by me and considered in my medical decision making (see chart for details).     MDM: 1.  Bilateral arm swelling-Advised/instructed patient to go to [facility] ED [facility] now for further evaluation of bilateral lower arm swelling and to rule out embolism given recent IV drug use.  Patient agreed and verbalized understanding of these instructions and this plan of care today.  Patient reports that his Father will be driving him to the ED now. Final Clinical Impressions(s) / UC Diagnoses   Final diagnoses:  Swelling of arm     Discharge Instructions      Advised/instructed patient to go to [facility] ED [facility] now for further evaluation of bilateral lower arm swelling and to rule out embolism given recent IV drug use.  Patient agreed and verbalized understanding of these instructions and this plan of care today.  Patient reports that his Father will be driving him to the ED now.     ED Prescriptions   None    PDMP not reviewed this encounter.   [facility], FNP 07/19/21 1610

## 2021-09-16 DIAGNOSIS — L02414 Cutaneous abscess of left upper limb: Secondary | ICD-10-CM | POA: Insufficient documentation

## 2021-09-16 DIAGNOSIS — Z8614 Personal history of Methicillin resistant Staphylococcus aureus infection: Secondary | ICD-10-CM | POA: Insufficient documentation

## 2022-03-27 ENCOUNTER — Emergency Department (HOSPITAL_COMMUNITY): Payer: Medicaid Other

## 2022-03-27 ENCOUNTER — Other Ambulatory Visit: Payer: Self-pay

## 2022-03-27 ENCOUNTER — Encounter (HOSPITAL_COMMUNITY): Payer: Self-pay | Admitting: Emergency Medicine

## 2022-03-27 ENCOUNTER — Emergency Department (HOSPITAL_COMMUNITY)
Admission: EM | Admit: 2022-03-27 | Discharge: 2022-03-27 | Disposition: A | Discharge status: Home / Self Care | Payer: Medicaid Other | Attending: Emergency Medicine | Admitting: Emergency Medicine

## 2022-03-27 DIAGNOSIS — R519 Headache, unspecified: Secondary | ICD-10-CM | POA: Diagnosis present

## 2022-03-27 DIAGNOSIS — R42 Dizziness and giddiness: Secondary | ICD-10-CM | POA: Diagnosis not present

## 2022-03-27 DIAGNOSIS — Z79899 Other long term (current) drug therapy: Secondary | ICD-10-CM | POA: Insufficient documentation

## 2022-03-27 DIAGNOSIS — I10 Essential (primary) hypertension: Secondary | ICD-10-CM | POA: Insufficient documentation

## 2022-03-27 LAB — CBC WITH DIFFERENTIAL/PLATELET
Abs Immature Granulocytes: 0.02 10*3/uL (ref 0.00–0.07)
Basophils Absolute: 0.1 10*3/uL (ref 0.0–0.1)
Basophils Relative: 1 %
Eosinophils Absolute: 0.3 10*3/uL (ref 0.0–0.5)
Eosinophils Relative: 6 %
HCT: 41.5 % (ref 39.0–52.0)
Hemoglobin: 14.3 g/dL (ref 13.0–17.0)
Immature Granulocytes: 0 %
Lymphocytes Relative: 24 %
Lymphs Abs: 1.4 10*3/uL (ref 0.7–4.0)
MCH: 32.3 pg (ref 26.0–34.0)
MCHC: 34.5 g/dL (ref 30.0–36.0)
MCV: 93.7 fL (ref 80.0–100.0)
Monocytes Absolute: 0.5 10*3/uL (ref 0.1–1.0)
Monocytes Relative: 8 %
Neutro Abs: 3.5 10*3/uL (ref 1.7–7.7)
Neutrophils Relative %: 61 %
Platelets: 163 10*3/uL (ref 150–400)
RBC: 4.43 MIL/uL (ref 4.22–5.81)
RDW: 12 % (ref 11.5–15.5)
WBC: 5.8 10*3/uL (ref 4.0–10.5)
nRBC: 0 % (ref 0.0–0.2)

## 2022-03-27 LAB — BASIC METABOLIC PANEL
Anion gap: 8 (ref 5–15)
BUN: 13 mg/dL (ref 6–20)
CO2: 27 mmol/L (ref 22–32)
Calcium: 9.2 mg/dL (ref 8.9–10.3)
Chloride: 104 mmol/L (ref 98–111)
Creatinine, Ser: 0.76 mg/dL (ref 0.61–1.24)
GFR, Estimated: >60 mL/min (ref >60)
Glucose, Bld: 125 mg/dL — ABNORMAL HIGH (ref 70–99)
Potassium: 3.8 mmol/L (ref 3.5–5.1)
Sodium: 139 mmol/L (ref 135–145)

## 2022-03-27 NOTE — ED Triage Notes (Signed)
Pt reports having CT of head 1 year ago and them finding a "spot" on it. Pt reports he has now been having headaches and dizziness and just wants to get checked out and make sure it has nothing to do with the "spot" they found 1 year ago.

## 2022-03-27 NOTE — ED Provider Notes (Signed)
[facility]-EMERGENCY DEPT Provider Note   CSN: [id number] Arrival date & time: 03/27/22  8182     History  Chief Complaint  Patient presents with  . Dizziness  . Headache    Peter Graham is a 56 y.o. male.   Dizziness Associated symptoms: headaches   Headache Associated symptoms: dizziness     Patient with medical history of substance use disorder, hypertension, hyperlipidemia, urinary retention presents today due to headache and dizziness.  States about a year ago he had a head and head injury, a hemangioma was reportedly found on the CT scan.  Has not followed up with neurosurgery.  Patient states has been having headaches since then which are usually intermittent, worse on the right side.  Sometimes there is dizziness such as the room spinning other times he feels lightheaded.  This typically resolve on their own.  Denies any loss of consciousness, fevers, chest pain, lateralized weakness or numbness, paresthesias, loss of vision.  States his father died unexpectedly recently of a stroke and he wants to get himself checked out.  Home Medications Prior to Admission medications   Medication Sig Start Date End Date Taking? Authorizing Provider  atenolol (TENORMIN) 50 MG tablet Take 50 mg by mouth daily.    [provider]  Doxazosin Mesylate (CARDURA PO) Take 6 mg by mouth at bedtime.    [provider]  doxycycline (VIBRAMYCIN) 100 MG capsule Take 1 capsule (100 mg total) by mouth 2 (two) times daily. 02/17/16   Horton, Mayer Masker, MD  enalapril (VASOTEC) 10 MG tablet Take 10 mg by mouth daily.    [provider]  gemfibrozil (LOPID) 600 MG tablet Take 600 mg by mouth 2 (two) times daily before a meal.    [provider]  ibuprofen (ADVIL,MOTRIN) 800 MG tablet Take 800 mg by mouth daily as needed for moderate pain.    [provider]  mupirocin nasal ointment (BACTROBAN) 2 % Apply in each nostril daily for 10 days  02/06/15   Burgess Amor, PA-C  omeprazole (PRILOSEC) 20 MG capsule Take 20 mg by mouth daily.    [provider]  oxyCODONE-acetaminophen (PERCOCET/ROXICET) 5-325 MG per tablet Take 1 tablet by mouth every 4 (four) hours as needed. 02/06/15   Burgess Amor, PA-C  ranitidine (ZANTAC) 150 MG tablet Take 150 mg by mouth 2 (two) times daily.    [provider]      Allergies    Patient has no known allergies.    Review of Systems   Review of Systems  Neurological:  Positive for dizziness and headaches.    Physical Exam Updated Vital Signs BP (!) 165/119 (BP Location: Left Arm)   Pulse 73   Temp 98.9 F (37.2 C) (Oral)   Resp 18   SpO2 98%  Physical Exam Vitals and nursing note reviewed. Exam conducted with a chaperone present.  Constitutional:      Appearance: Normal appearance.  HENT:     Head: Normocephalic and atraumatic.  Eyes:     General: No scleral icterus.       Right eye: No discharge.        Left eye: No discharge.     Extraocular Movements: Extraocular movements intact.     Pupils: Pupils are equal, round, and reactive to light.  Neck:     Meningeal: Brudzinski's sign and Kernig's sign absent.     Comments: No managements, no JVD Cardiovascular:     Rate and Rhythm: Normal  rate and regular rhythm.     Pulses: Normal pulses.     Heart sounds: Normal heart sounds. No murmur heard.    No friction rub. No gallop.  Pulmonary:     Effort: Pulmonary effort is normal. No respiratory distress.     Breath sounds: Normal breath sounds.  Abdominal:     General: Abdomen is flat. Bowel sounds are normal. There is no distension.     Palpations: Abdomen is soft.     Tenderness: There is no abdominal tenderness.  Musculoskeletal:     Cervical back: Normal range of motion. No rigidity.  Skin:    General: Skin is warm and dry.     Coloration: Skin is not jaundiced.  Neurological:     Mental Status: He is alert. Mental status is at baseline.     GCS: GCS eye  subscore is 4. GCS verbal subscore is 5. GCS motor subscore is 6.     Cranial Nerves: No cranial nerve deficit or facial asymmetry.     Sensory: No sensory deficit.     Coordination: Romberg sign negative. Coordination normal.     Comments: Cranial nerves III through XII are grossly intact.  Grips equal bilaterally, lower extremity strength equal bilaterally.  No pronator drift, normal finger-nose.  Psychiatric:        Mood and Affect: Mood normal.    ED Results / Procedures / Treatments   Labs (all labs ordered are listed, but only abnormal results are displayed) Labs Reviewed  BASIC METABOLIC PANEL  CBC WITH DIFFERENTIAL/PLATELET    EKG None  Radiology No results found.  Procedures Procedures    Medications Ordered in ED Medications - No data to display  ED Course/ Medical Decision Making/ A&P                           Medical Decision Making Amount and/or Complexity of Data Reviewed Labs: ordered. Radiology: ordered.   Patient presents due to headache.  Differential includes not limited to intracranial mass, SAH, TIA/stroke, electrolyte derangement, meningitis.  On exam there are no focal deficits.  His rhythm is regular, his vitals are stable.  I considered meningitis but he is not febrile, not have any neck stiffness and I think that is unlikely.  There are also no lateralized weakness or numbness so I do not think this is a TIA or stroke especially given the chronicity of symptoms.  I ordered and viewed laboratory work-up. No leukocytosis or anemia. No gross electrolyte derangement or AKI.  I reviewed previous records including CT head from last year.  I compared it to the CT that was obtained today.  CT today shows frontal opacity which is the same size as previous imaging.  Suspicious for meningioma, no signs of SAH.  Agree with radiologist interpretation.  Patient reevaluated.  Resting comfortably in the room, he is not currently having a headache.  I  discussed follow-up plan with the patient.  Will refer to neurology for follow-up for the possible meningioma.  Encourage patient to follow-up with primary care doctor.  His blood pressure slightly high in the ED today, he has not been taking his blood pressure medicine I encouraged him to restart that.          Final Clinical Impression(s) / ED Diagnoses Final diagnoses:  None    Rx / DC Orders ED Discharge Orders     None  Theron Arista, PA-C 03/27/22 1421    Tegeler, Canary Brim, MD 03/27/22 [phone]

## 2022-03-27 NOTE — Discharge Instructions (Addendum)
He was seen today in the the emergency department for headache.  The scan of your head shows the same opacity in your frontal lobe which is unchanged.  Should follow-up with neurology regarding this finding, information provided above to schedule an appointment.  Follow-up with your primary care doctor next week for reevaluation.  Have a recheck of blood pressure and come up with a plan for preventative care.  If you have any chest pain, weakness to 1 side of your body, facial droop, slurred speech, loss of vision return back to ED for evaluation.  The scan of your head is listed below that we can refer to the findings.  FINDINGS:  Brain: No evidence of acute infarction, hemorrhage, hydrocephalus,  extra-axial collection or mass lesion/mass effect. Right frontal  extra-axial ovoid calcification, unchanged, which may represent  calcified meningioma.    Vascular: No hyperdense vessel or unexpected calcification.    Skull: Normal. Negative for fracture or focal lesion.    Sinuses/Orbits: No acute finding.    Other: None.    IMPRESSION:  No acute intracranial abnormality.

## 2022-05-21 ENCOUNTER — Ambulatory Visit (INDEPENDENT_AMBULATORY_CARE_PROVIDER_SITE_OTHER): Payer: Medicaid Other

## 2022-05-21 ENCOUNTER — Ambulatory Visit (INDEPENDENT_AMBULATORY_CARE_PROVIDER_SITE_OTHER): Payer: Medicaid Other | Admitting: Podiatry

## 2022-05-21 ENCOUNTER — Encounter: Payer: Self-pay | Admitting: Podiatry

## 2022-05-21 DIAGNOSIS — M25571 Pain in right ankle and joints of right foot: Secondary | ICD-10-CM

## 2022-05-21 DIAGNOSIS — G629 Polyneuropathy, unspecified: Secondary | ICD-10-CM

## 2022-05-21 DIAGNOSIS — M19071 Primary osteoarthritis, right ankle and foot: Secondary | ICD-10-CM | POA: Diagnosis not present

## 2022-05-21 DIAGNOSIS — M79672 Pain in left foot: Secondary | ICD-10-CM

## 2022-05-21 DIAGNOSIS — L97512 Non-pressure chronic ulcer of other part of right foot with fat layer exposed: Secondary | ICD-10-CM

## 2022-05-21 DIAGNOSIS — M79671 Pain in right foot: Secondary | ICD-10-CM

## 2022-05-21 DIAGNOSIS — M19079 Primary osteoarthritis, unspecified ankle and foot: Secondary | ICD-10-CM

## 2022-05-21 NOTE — Progress Notes (Signed)
  Subjective:  Patient ID: Peter Graham, male    DOB: 04/10/1966,   MRN: [id number]  Chief Complaint  Patient presents with   Foot Problem    Bilateral foot pain, right foot pinky toe spot     56 y.o. male presents for concern of bilateral foot and ankle pain that has been ongoing for 20 plus years. Relates numbness in his foot as well. He is not diabetic. Also relates a spot on his pinky toe that he would like looked at. Was actually referred here for the pinky toe and there was concern for infection in the toe. Has been on antibiotics and previously finished up with them a few days ago.  Denies any other treatments.  . Denies any other pedal complaints. Denies n/v/f/c.   Past Medical History:  Diagnosis Date   Hypertension     Objective:  Physical Exam: Vascular: DP/PT pulses 2/4 bilateral. CFT <3 seconds. Normal hair growth on digits. No edema.  Skin. No lacerations or abrasions bilateral feet. Right medial fifth digiti with 0.1 cm x 0.1 cm x 0.2 cm ulceration with granular base. Mild erythema surroudning.  Musculoskeletal: MMT 5/5 bilateral lower extremities in DF, PF, Inversion and Eversion. Deceased ROM in DF of ankle joint.  Neurological: Sensation intact to light touch.   Assessment:   1. Toe ulcer, right, with fat layer exposed ([place])   2. Neuropathy   3. Ankle arthritis      Plan:  Patient was evaluated and treated and all questions answered. Ulcer right fifth medial digit with fat layer exposed  -Debridement as below. -Dressed with neosporin, DSD. -Off-loading with toe cap -No abx indicated.  -Discussed glucose control and proper protein-rich diet.  -Discussed if any worsening redness, pain, fever or chills to call or may need to report to the emergency room. Patient expressed understanding.   Procedure: Excisional Debridement of Wound Rationale: Removal of non-viable soft tissue from the wound to promote healing.  Anesthesia: none Pre-Debridement Wound  Measurements: Overlying callus  Post-Debridement Wound Measurements: 0.1 cm x 0.1 cm x 0.2 cm  Type of Debridement: Sharp Excisional Tissue Removed: Non-viable soft tissue Depth of Debridement: subcutaneous tissue. Technique: Sharp excisional debridement to bleeding, viable wound base.  Dressing: Dry, sterile, compression dressing. Disposition: Patient tolerated procedure well. Patient to return in 2 week for follow-up.  Return in about 2 weeks (around 06/04/2022) for wound check.   Discussed neuropathy  and arthritis and etiology as well as treatment with patient.  Radiographs reviewed and discussed with patient. No acute fractures or dislocations. Callous formation of left second distal metatarsal diaphysis with shortening of second metatarsal representing age indeterminate fracture. Os trigonum noted bilateral. Likely age indeterminate fracture callus around medial and lateral malleolus right ankle.  -Discussed and educated patient on foot care, especially with  regards to the vascular, neurological and musculoskeletal systems.  -Discussed supportive shoes at all times and checking feet regularly.  -Discussed topical treatment options.  -Continue with gabapentin. Follow-up with PCP for dosing.      Lorenda Peck, DPM

## 2022-07-31 DIAGNOSIS — F1120 Opioid dependence, uncomplicated: Secondary | ICD-10-CM | POA: Insufficient documentation

## 2022-10-07 ENCOUNTER — Other Ambulatory Visit: Payer: Self-pay

## 2022-10-07 ENCOUNTER — Emergency Department (HOSPITAL_COMMUNITY)
Admission: EM | Admit: 2022-10-07 | Discharge: 2022-10-07 | Disposition: A | Discharge status: Home / Self Care | Payer: Medicaid Other | Attending: Emergency Medicine | Admitting: Emergency Medicine

## 2022-10-07 ENCOUNTER — Encounter (HOSPITAL_COMMUNITY): Payer: Self-pay

## 2022-10-07 ENCOUNTER — Emergency Department (HOSPITAL_COMMUNITY): Payer: Medicaid Other

## 2022-10-07 DIAGNOSIS — R079 Chest pain, unspecified: Secondary | ICD-10-CM | POA: Diagnosis not present

## 2022-10-07 DIAGNOSIS — I10 Essential (primary) hypertension: Secondary | ICD-10-CM | POA: Insufficient documentation

## 2022-10-07 DIAGNOSIS — Z79899 Other long term (current) drug therapy: Secondary | ICD-10-CM | POA: Insufficient documentation

## 2022-10-07 LAB — CBC
HCT: 43.5 % (ref 39.0–52.0)
Hemoglobin: 14.6 g/dL (ref 13.0–17.0)
MCH: 32.9 pg (ref 26.0–34.0)
MCHC: 33.6 g/dL (ref 30.0–36.0)
MCV: 98 fL (ref 80.0–100.0)
Platelets: 223 10*3/uL (ref 150–400)
RBC: 4.44 MIL/uL (ref 4.22–5.81)
RDW: 13.3 % (ref 11.5–15.5)
WBC: 10.2 10*3/uL (ref 4.0–10.5)
nRBC: 0 % (ref 0.0–0.2)

## 2022-10-07 LAB — TROPONIN I (HIGH SENSITIVITY)
Troponin I (High Sensitivity): 4 ng/L (ref <18)
Troponin I (High Sensitivity): 4 ng/L (ref <18)

## 2022-10-07 LAB — BASIC METABOLIC PANEL
Anion gap: 12 (ref 5–15)
BUN: 16 mg/dL (ref 6–20)
CO2: 26 mmol/L (ref 22–32)
Calcium: 8.9 mg/dL (ref 8.9–10.3)
Chloride: 100 mmol/L (ref 98–111)
Creatinine, Ser: 0.92 mg/dL (ref 0.61–1.24)
GFR, Estimated: >60 mL/min (ref >60)
Glucose, Bld: 191 mg/dL — ABNORMAL HIGH (ref 70–99)
Potassium: 3.9 mmol/L (ref 3.5–5.1)
Sodium: 138 mmol/L (ref 135–145)

## 2022-10-07 LAB — D-DIMER, QUANTITATIVE: D-Dimer, Quant: 0.59 ug/mL-FEU — ABNORMAL HIGH (ref 0.00–0.50)

## 2022-10-07 MED ORDER — SODIUM CHLORIDE (PF) 0.9 % IJ SOLN
INTRAMUSCULAR | Status: AC
  Filled 2022-10-07: qty 50

## 2022-10-07 MED ORDER — IOHEXOL 350 MG/ML SOLN
75.0000 mL | Freq: Once | INTRAVENOUS | Status: AC | PRN
  Administered 2022-10-07: 75 mL via INTRAVENOUS

## 2022-10-07 NOTE — ED Triage Notes (Signed)
Pt arrived via POV. Pt reported chest pain at 2 am last night for about 2 hours. No chest pain upon arrival.  No radiation or SOB reported  Pt took clonazepam and reported it helped.  AOx4

## 2022-10-07 NOTE — ED Provider Notes (Signed)
[facility] Provider Note   CSN: [id number] Arrival date & time: 10/07/22  1205     History  Chief Complaint  Patient presents with   Chest Pain    Peter Graham is a 57 y.o. male with a past medical history of substance use disorder, hypertension, hyperlipidemia, MVP presenting today for evaluation of chest pain. Pt reports that he had an acute onset of chest pain that started around 2 am today. It lasted for 1.5 hours, tightness feeling across his chest with associated heart racing and shortness of breath. He denies nausea, vomiting, dizziness, extremity weakness or numbness. Denies recent travel or surgery, hx of cancer, cough, fever. He reports taking clonazepam which helped. He states he had panic attack before which was similar.   Chest Pain   Past Medical History:  Diagnosis Date   Hypertension    Past Surgical History:  Procedure Laterality Date   HERNIA REPAIR     NASAL SEPTUM SURGERY       Home Medications Prior to Admission medications   Medication Sig Start Date End Date Taking? Authorizing Provider  atenolol (TENORMIN) 50 MG tablet Take 50 mg by mouth daily.    [provider]  Doxazosin Mesylate (CARDURA PO) Take 6 mg by mouth at bedtime.    [provider]  doxycycline (VIBRAMYCIN) 100 MG capsule Take 1 capsule (100 mg total) by mouth 2 (two) times daily. 02/17/16   Horton, Barbette Hair, MD  enalapril (VASOTEC) 10 MG tablet Take 10 mg by mouth daily.    [provider]  gemfibrozil (LOPID) 600 MG tablet Take 600 mg by mouth 2 (two) times daily before a meal.    [provider]  ibuprofen (ADVIL,MOTRIN) 800 MG tablet Take 800 mg by mouth daily as needed for moderate pain.    [provider]  mupirocin nasal ointment (BACTROBAN) 2 % Apply in each nostril daily for 10 days 02/06/15   Evalee Jefferson, PA-C  omeprazole (PRILOSEC) 20 MG capsule Take 20 mg by mouth daily.    [provider]  oxyCODONE-acetaminophen (PERCOCET/ROXICET) 5-325 MG per tablet Take 1 tablet by mouth every 4 (four) hours as needed. 02/06/15   Evalee Jefferson, PA-C  ranitidine (ZANTAC) 150 MG tablet Take 150 mg by mouth 2 (two) times daily.    [provider]      Allergies    Patient has no known allergies.    Review of Systems   Review of Systems  Cardiovascular:  Positive for chest pain.    Physical Exam Updated Vital Signs BP 111/76   Pulse (!) 55   Temp 98.1 F (36.7 C) (Oral)   Resp 12   Ht 6' 1"$  (1.854 m)   Wt 102.1 kg   SpO2 94%   BMI 29.69 kg/m  Physical Exam Vitals and nursing note reviewed.  Constitutional:      Appearance: Normal appearance.  HENT:     Head: Normocephalic and atraumatic.     Mouth/Throat:     Mouth: Mucous membranes are moist.  Eyes:     General: No scleral icterus. Cardiovascular:     Rate and Rhythm: Normal rate and regular rhythm.     Pulses: Normal pulses.     Heart sounds: Normal heart sounds.  Pulmonary:     Effort: Pulmonary effort is normal.     Breath sounds: Normal breath sounds.  Abdominal:     General: Abdomen is flat.  Palpations: Abdomen is soft.     Tenderness: There is no abdominal tenderness.  Musculoskeletal:        General: No deformity.  Skin:    General: Skin is warm.     Findings: No rash.  Neurological:     General: No focal deficit present.     Mental Status: He is alert.  Psychiatric:        Mood and Affect: Mood normal.     ED Results / Procedures / Treatments   Labs (all labs ordered are listed, but only abnormal results are displayed) Labs Reviewed  BASIC METABOLIC PANEL - Abnormal; Notable for the following components:      Result Value   Glucose, Bld 191 (*)    All other components within normal limits  D-DIMER, QUANTITATIVE - Abnormal; Notable for the following components:   D-Dimer, Quant 0.59 (*)    All other components within normal limits  CBC  TROPONIN I (HIGH SENSITIVITY)   TROPONIN I (HIGH SENSITIVITY)    EKG EKG Interpretation  Date/Time:  Wednesday October 07 2022 12:18:09 EST Ventricular Rate:  71 PR Interval:  213 QRS Duration: 95 QT Interval:  371 QTC Calculation: 404 R Axis:   44 Text Interpretation: Sinus rhythm Prolonged PR interval Abnormal R-wave progression, early transition Inferior infarct, old Lateral leads are also involved no prior ECG for coparison. possible S1Q3T3. No STEMI Confirmed by Antony Blackbird [phone]) on 10/07/2022 12:45:23 PM  Radiology CT Angio Chest PE W and/or Wo Contrast  Result Date: 10/07/2022 CLINICAL DATA:  Chest pain. EXAM: CT ANGIOGRAPHY CHEST WITH CONTRAST TECHNIQUE: Multidetector CT imaging of the chest was performed using the standard protocol during bolus administration of intravenous contrast. Multiplanar CT image reconstructions and MIPs were obtained to evaluate the vascular anatomy. RADIATION DOSE REDUCTION: This exam was performed according to the departmental dose-optimization program which includes automated exposure control, adjustment of the mA and/or kV according to patient size and/or use of iterative reconstruction technique. CONTRAST:  22m OMNIPAQUE IOHEXOL 350 MG/ML SOLN COMPARISON:  None Available. FINDINGS: Cardiovascular: Satisfactory opacification of the pulmonary arteries to the segmental level. No evidence of pulmonary embolism. Normal heart size. No pericardial effusion. Mild coronary artery calcifications are noted. Mediastinum/Nodes: No enlarged mediastinal, hilar, or axillary lymph nodes. Thyroid gland, trachea, and esophagus demonstrate no significant findings. Lungs/Pleura: Lungs are clear. No pleural effusion or pneumothorax. Upper Abdomen: No acute abnormality. Musculoskeletal: No chest wall abnormality. No acute or significant osseous findings. Review of the MIP images confirms the above findings. IMPRESSION: No definite evidence of pulmonary embolus. Mild coronary artery calcifications are  noted. No acute abnormality seen in the chest. Electronically Signed   By: JMarijo ConceptionM.D.   On: 10/07/2022 14:24   DG Chest 2 View  Result Date: 10/07/2022 CLINICAL DATA:  Chest pain since 20 o'clock this morning. EXAM: CHEST - 2 VIEW COMPARISON:  None Available. FINDINGS: The cardiac silhouette, mediastinal and hilar contours are within normal limits. Streaky basilar atelectasis but no infiltrates or effusions. No pulmonary lesions or pneumothorax. The bony thorax is intact. IMPRESSION: Streaky basilar atelectasis but no infiltrates or effusions. Electronically Signed   By: PMarijo SanesM.D.   On: 10/07/2022 12:43    Procedures Procedures    Medications Ordered in ED Medications  sodium chloride (PF) 0.9 % injection (  Not Given 10/07/22 1431)  iohexol (OMNIPAQUE) 350 MG/ML injection 75 mL (75 mLs Intravenous Contrast Given 10/07/22 1354)    ED Course/ Medical Decision Making/  A&P Clinical Course as of 10/07/22 1520  Wed Oct 07, 2022  1336 D-Dimer, Quant(!): 0.59 [KL]    Clinical Course User Index [KL] Rex Kras, [place]             HEART Score: 2                Medical Decision Making Amount and/or Complexity of Data Reviewed Labs: ordered. Decision-making details documented in ED Course. Radiology: ordered.  Risk Prescription drug management.   This patient presents to the ED for chest pain, this involves an extensive number of treatment options, and is a complaint that carries with a high risk of complications and morbidity.  The differential diagnosis includes ACS, pericarditis, PE, pneumothorax, pneumonia, less likely dissection with essentially normal blood pressure, symmetric bilateral pulses, and no back pain.  This is not an exhaustive list.  Lab tests: I ordered and personally interpreted labs.  The pertinent results include: WBC unremarkable. Hbg unremarkable. Platelets unremarkable. No electrolyte abnormalities noted. BUN, creatinine unremarkable. UA significant for no  acute abnormality. D-dimer 0.59.  Imaging studies: I ordered imaging studies. I personally reviewed, interpreted imaging and agree with the radiologist's interpretations. The results include: CTA negative for PE. Chest XR normal.   Problem list/ ED course/ Critical interventions/ Medical management: HPI: See above Vital signs within normal range and stable throughout visit. Laboratory/imaging studies significant for: See above. On physical examination, patient is afebrile and appears in no acute distress. Exam without evidence of volume overload so doubt heart failure. EKG without signs of active ischemia. Given the timing of pain to ER presentation, delta troponin was negative so doubt NSTEMI. Presentation not consistent with acute PE ([facility] low risk, negative CTA) ,pneumothorax (not visualized on chest xr), thoracic aortic dissection, pericarditis, tamponade, pneumonia (no infectious symptoms, clear chest xr), myocarditis (no recent illness, neg trop). HEART score:2 so plan to discharge patient home with cardiology follow up. I have reviewed the patient home medicines and have made adjustments as needed.  Cardiac monitoring/EKG: The patient was maintained on a cardiac monitor.  I personally reviewed and interpreted the cardiac monitor which showed an underlying rhythm of: sinus rhythm.  Additional history obtained: External records from outside source obtained and reviewed including: Chart review including previous notes, labs, imaging.  Disposition Continued outpatient therapy. Follow-up with cardiology recommended for reevaluation of symptoms. Treatment plan discussed with patient.  Pt acknowledged understanding was agreeable to the plan. Worrisome signs and symptoms were discussed with patient, and patient acknowledged understanding to return to the ED if they noticed these signs and symptoms. Patient was stable upon discharge.   This chart was dictated using voice recognition software.   Despite best efforts to proofread,  errors can occur which can change the documentation meaning.          Final Clinical Impression(s) / ED Diagnoses Final diagnoses:  Chest pain, unspecified type    Rx / DC Orders ED Discharge Orders          Ordered    Ambulatory referral to Cardiology       Comments: If you have not heard from the Cardiology office within the next 72 hours please call [phone].   10/07/22 1520              Rex Kras, [place] 10/08/22 1123    Tegeler, Gwenyth Allegra, MD 10/10/22 [phone]

## 2022-10-07 NOTE — Discharge Instructions (Addendum)
Your workup was reassuring today. Take tylenol/ibuprofen for pain. I recommend close follow-up with cardiology for reevaluation.  Please do not hesitate to return to emergency department if worrisome signs symptoms we discussed become apparent.

## 2022-10-11 IMAGING — CT CT CERVICAL SPINE W/O CM
3 of 4 series · 9 of 33 positions shown, 11 images · non-contrast
Comparison: None.

CLINICAL DATA: Metal box fell on head 2 days ago. Headache and
left-sided neck pain.

EXAM:
CT HEAD WITHOUT CONTRAST
CT CERVICAL SPINE WITHOUT CONTRAST
TECHNIQUE: Multidetector CT imaging of the head and cervical spine was
performed following the standard protocol without intravenous
contrast. Multiplanar CT image reconstructions of the cervical spine
were also generated.

[Series 6: orthogonal bone · axial · 0.20mm/px · z∈[-322,-322]mm · 1 of 110 slices shown, 2 images]
[im 63/110  soft-tissue]
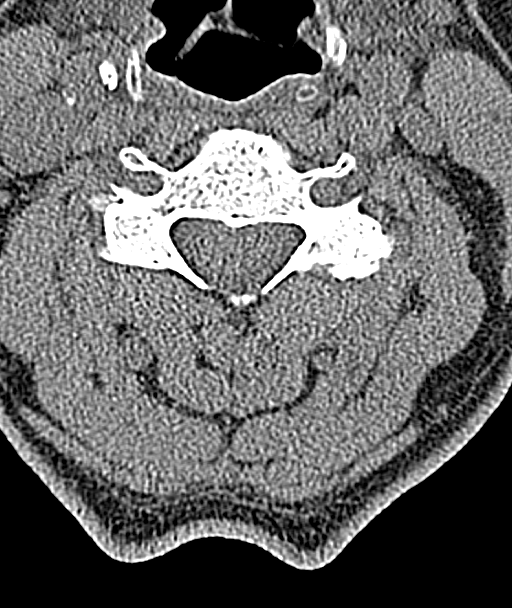
[im 63/110  bone]
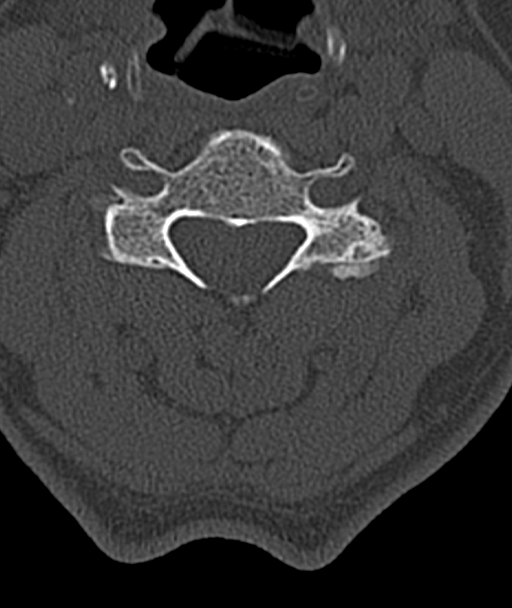

[Series 7: coronal bone · coronal · 0.20mm/px · 3 of 61 slices shown]
[im 13/61  bone]
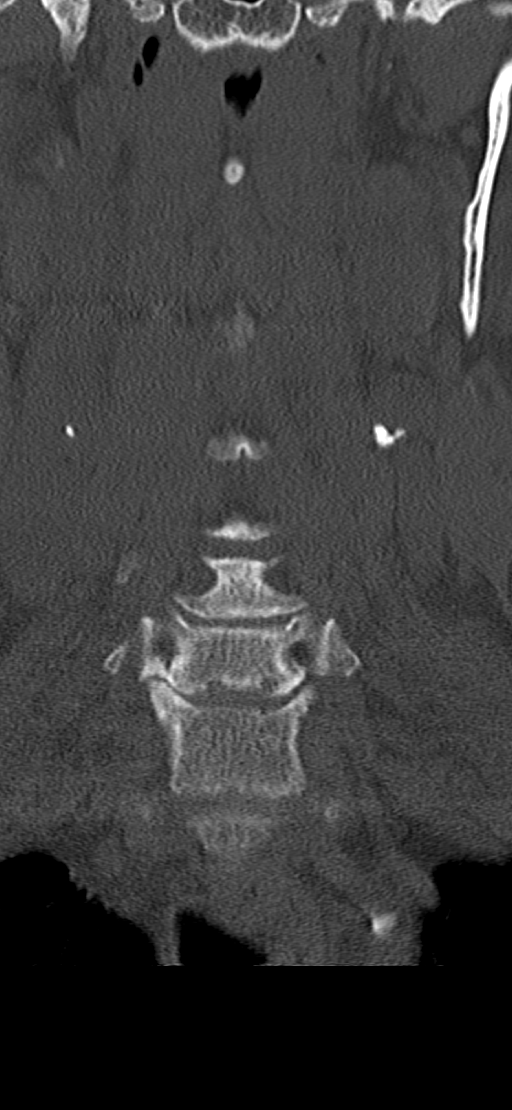
[im 25/61  bone]
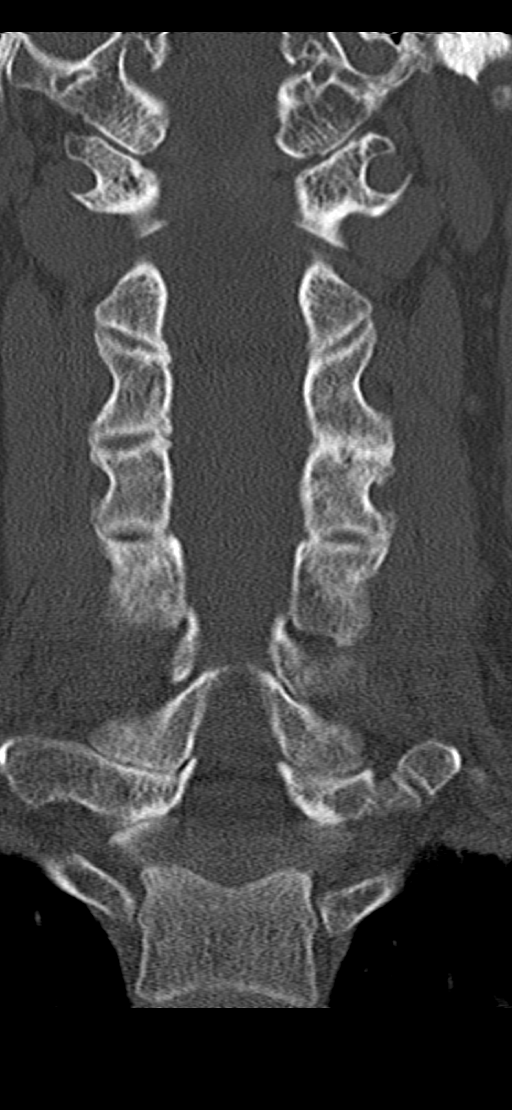
[im 37/61  bone]
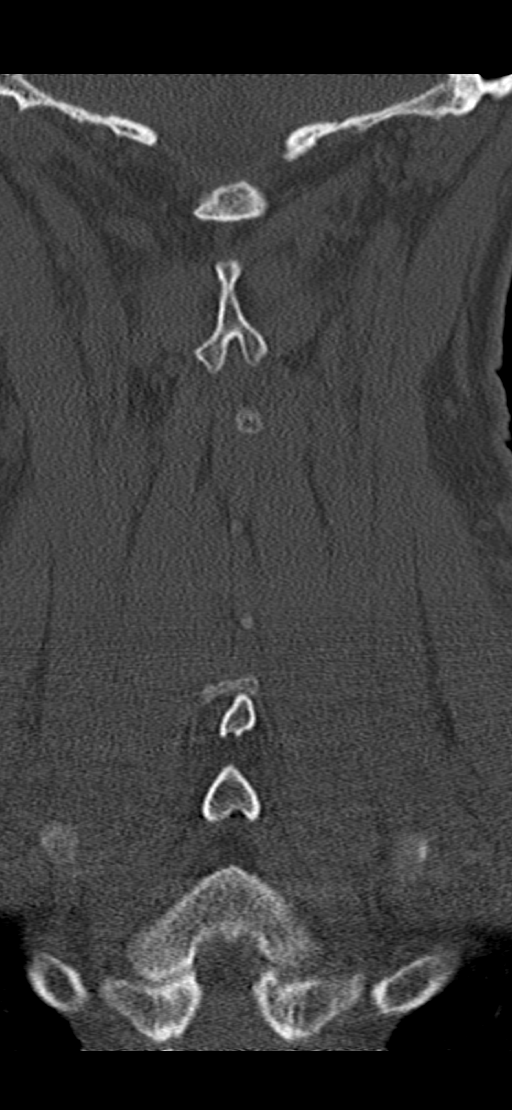

[Series 8: sagittal bone · sagittal · 0.23mm/px · 5 of 51 slices shown, 6 images]
[im 17/51  bone]
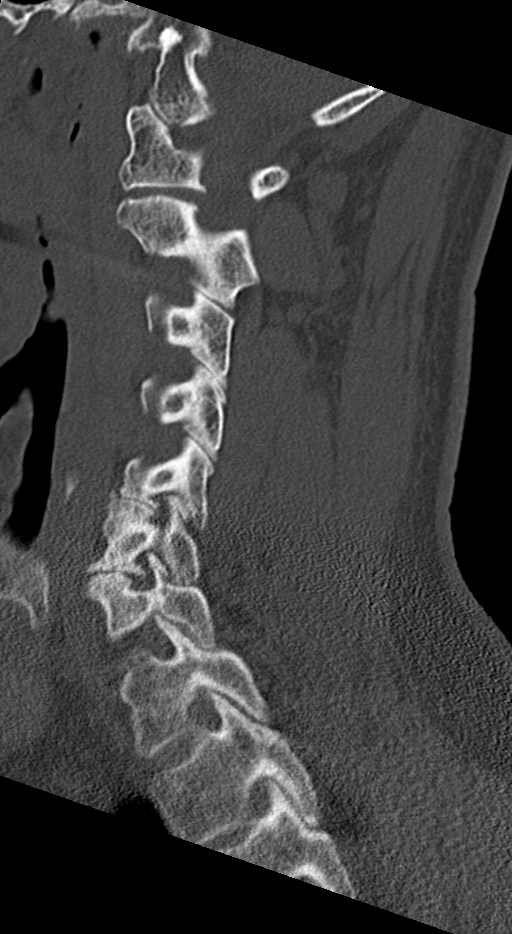
[im 21/51  bone]
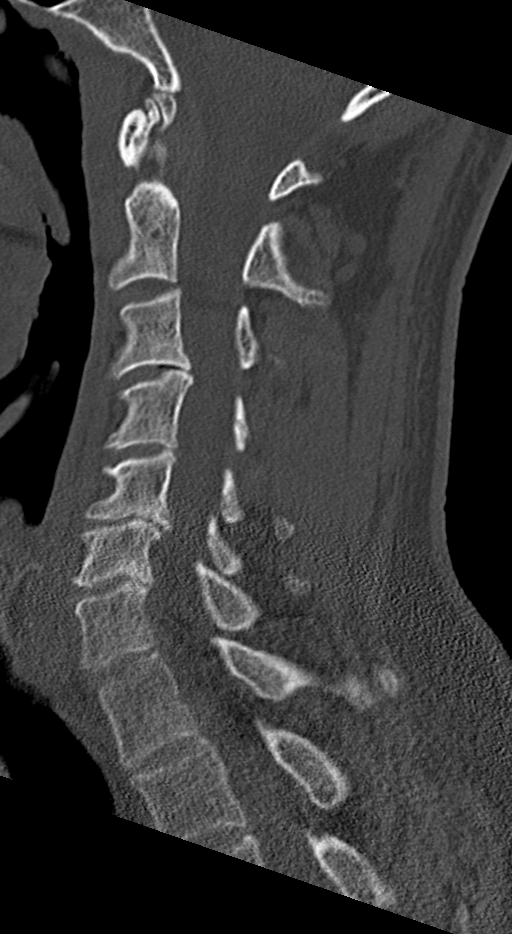
[im 26/51  soft-tissue]
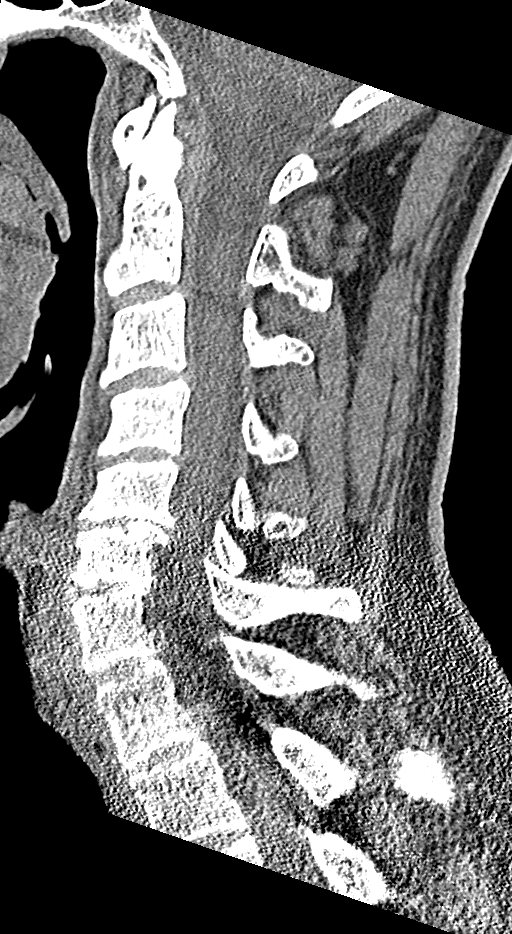
[im 26/51  bone]
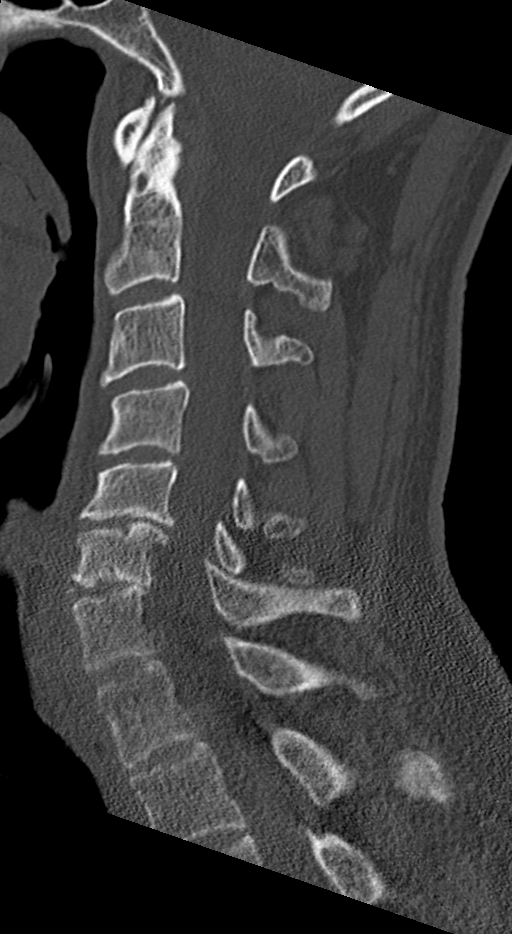
[im 30/51  bone]
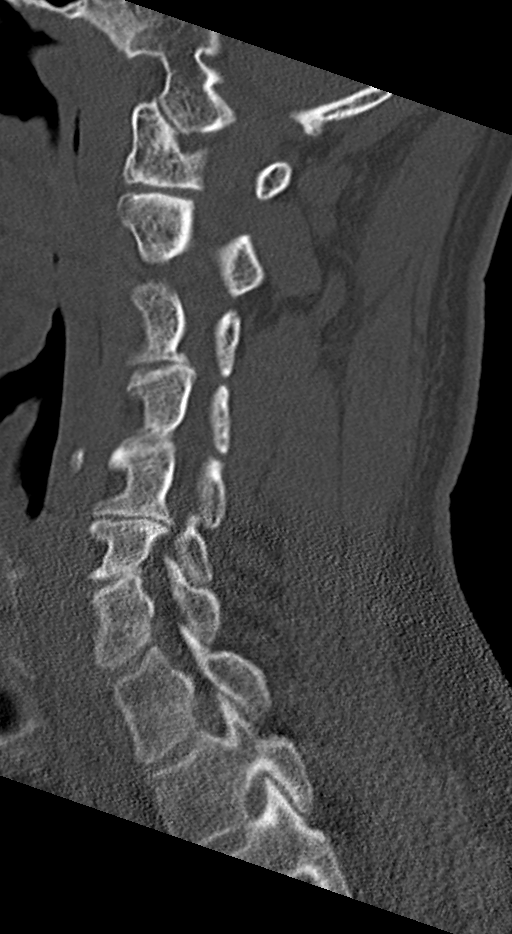
[im 34/51  bone]
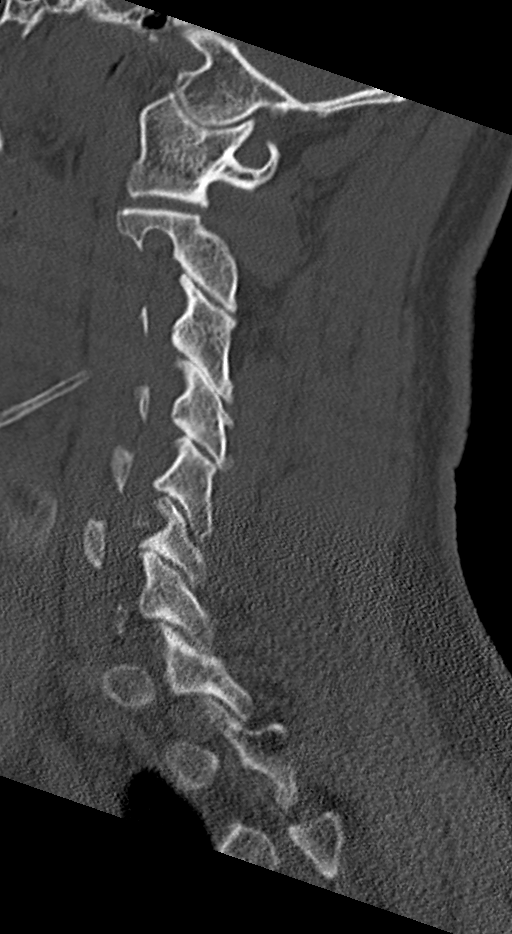

[9 of 33 positions shown; findings below may reference images not displayed]

FINDINGS: CT HEAD FINDINGS

Brain: No evidence of acute infarction, hemorrhage, hydrocephalus,
extra-axial collection or mass effect. Ovoid calcified lesion
overlying the right frontal lobe measuring 8 x 4 mm is favored to be
a meningioma.

Vascular: No hyperdense vessel or unexpected calcification.

Skull: Normal. Negative for fracture or focal lesion.

Sinuses/Orbits: No acute finding.

Other: None.

CT CERVICAL SPINE FINDINGS

Alignment: Within normal limits.

Skull base and vertebrae: No acute fracture. No primary bone lesion
or focal pathologic process.

Soft tissues and spinal canal: No prevertebral fluid or swelling. No
visible canal hematoma.

Disc levels: Degenerative changes seen throughout the cervical spine
with greatest disease burden at C5-C6 with posterior spondylotic
ridge causing severe bilateral neural foraminal stenosis and
effacement of the ventral thecal sac.

Upper chest: Negative.

Other: None.
IMPRESSION: 1. No acute intracranial abnormality.
2. No acute abnormality of the cervical or visualized upper thoracic
spine.

## 2022-12-01 ENCOUNTER — Ambulatory Visit: Payer: Medicaid Other | Attending: Internal Medicine | Admitting: Internal Medicine

## 2022-12-07 ENCOUNTER — Encounter: Payer: Self-pay | Admitting: General Practice

## 2022-12-29 ENCOUNTER — Encounter: Payer: Self-pay | Admitting: Internal Medicine

## 2022-12-29 ENCOUNTER — Ambulatory Visit: Payer: Medicaid Other | Attending: Internal Medicine | Admitting: Internal Medicine

## 2022-12-29 VITALS — BP 122/80 | HR 84 | Ht 73.0 in | Wt 229.6 lb

## 2022-12-29 DIAGNOSIS — I341 Nonrheumatic mitral (valve) prolapse: Secondary | ICD-10-CM | POA: Diagnosis not present

## 2022-12-29 DIAGNOSIS — R079 Chest pain, unspecified: Secondary | ICD-10-CM

## 2022-12-29 DIAGNOSIS — R9431 Abnormal electrocardiogram [ECG] [EKG]: Secondary | ICD-10-CM | POA: Insufficient documentation

## 2022-12-29 DIAGNOSIS — E785 Hyperlipidemia, unspecified: Secondary | ICD-10-CM | POA: Insufficient documentation

## 2022-12-29 MED ORDER — ASPIRIN 81 MG PO TBEC: 81.0000 mg | DELAYED_RELEASE_TABLET | Freq: Every day | ORAL | 3 refills | Status: AC

## 2022-12-29 MED ORDER — ATORVASTATIN CALCIUM 20 MG PO TABS: 20.0000 mg | ORAL_TABLET | Freq: Every day | ORAL | 3 refills | Status: AC

## 2022-12-29 NOTE — Progress Notes (Signed)
Cardiology Office Note:    Date:  12/29/2022   ID:  Peter Graham, DOB 1965-11-12, MRN [id number]  PCP:  System, Provider Not In   [facility] Providers Cardiologist:  Maisie Fus, MD     Referring MD: Jeanelle Malling, [place]   No chief complaint on file. CVD risk assesment   History of Present Illness:    Peter Graham is a 57 y.o. male with a hx of HTN, ?MVP, substance abuse disorder, who presented to the ED with CP in Feb 2024.   Per ED note: "Pt reports that he had an acute onset of chest pain that started around 2 am today. It lasted for 1.5 hours, tightness feeling across his chest with associated heart racing and shortness of breath. He denies nausea, vomiting, dizziness, extremity weakness or numbness. Denies recent travel or surgery, hx of cancer, cough, fever. He reports taking clonazepam which helped. He states he had panic attack before which was similar. " EKG showed sinus rhythm, inferior Q. Troponin was negative He's had multiple ED visits.   Today, he denies CP.  No SOB. No DM2. He works outside. He has chronic joint pain.   No orthopnea/PND.   Smoked cigarettes , many years  Father died of CVA, he had prior stents. Uncles had heart disease.  Blood pressure is well controlled  The 10-year ASCVD risk score (Arnett DK, et al., 2019) is: 9.2%   Values used to calculate the score:     Age: 22 years     Sex: Male     Is Non-Hispanic African American: No     Diabetic: No     Tobacco smoker: No     Systolic Blood Pressure: 122 mmHg     Is BP treated: No     HDL Cholesterol: 32 mg/dL     Total Cholesterol: 209 mg/dL   Past Medical History:  Diagnosis Date   Hypertension     Past Surgical History:  Procedure Laterality Date   HERNIA REPAIR     NASAL SEPTUM SURGERY      Current Medications: Current Outpatient Medications on File Prior to Visit  Medication Sig Dispense Refill   clonazePAM (KLONOPIN) 1 MG tablet Take 1 mg by mouth 3 (three) times daily.      gabapentin (NEURONTIN) 300 MG capsule Take 300 mg by mouth 3 (three) times daily.     gabapentin (NEURONTIN) 600 MG tablet Take 600 mg by mouth 3 (three) times daily as needed.     tadalafil (CIALIS) 5 MG tablet Take 5 mg by mouth daily as needed.     tamsulosin (FLOMAX) 0.4 MG CAPS capsule Take 0.4 mg by mouth daily.     No current facility-administered medications on file prior to visit.     Allergies:   Patient has no known allergies.   Social History   Socioeconomic History   Marital status: Single    Spouse name: Not on file   Number of children: Not on file   Years of education: Not on file   Highest education level: Not on file  Occupational History   Not on file  Tobacco Use   Smoking status: Former    Packs/day: 1.00    Years: 20.00    Additional pack years: 0.00    Total pack years: 20.00    Types: Cigarettes    Quit date: 08/31/2021    Years since quitting: 1.3   Smokeless tobacco: Not on file  Substance and Sexual  Activity   Alcohol use: No   Drug use: Not Currently    Types: IV   Sexual activity: Not on file  Other Topics Concern   Not on file  Social History Narrative   Not on file   Social Determinants of Health   Financial Resource Strain: Not on file  Food Insecurity: Not on file  Transportation Needs: Not on file  Physical Activity: Not on file  Stress: Not on file  Social Connections: Not on file     Family History: Per above  ROS:   Please see the history of present illness.     All other systems reviewed and are negative.  EKGs/Labs/Other Studies Reviewed:    The following studies were reviewed today:   EKG:  EKG is  ordered today.  The ekg ordered today demonstrates   12/28/2021- NSR, inferior Q waves  Recent Labs: 10/07/2022: BUN 16; Creatinine, Ser 0.92; Hemoglobin 14.6; Platelets 223; Potassium 3.9; Sodium 138   Recent Lipid Panel No results found for: "CHOL", "TRIG", "HDL", "CHOLHDL", "VLDL", "LDLCALC",  "LDLDIRECT"   Risk Assessment/Calculations:     Physical Exam:    VS:   Vitals:   12/29/22 1517  BP: 122/80  Pulse: 84  SpO2: 92%    Wt Readings from Last 3 Encounters:  12/29/22 229 lb 9.6 oz (104.1 kg)  10/07/22 225 lb (102.1 kg)  07/19/21 192 lb (87.1 kg)     GEN:  Well nourished, well developed in no acute distress HEENT: Normal NECK: No JVD; CARDIAC: RRR, no murmurs, rubs, gallops RESPIRATORY:  Clear to auscultation without rales, wheezing or rhonchi  ABDOMEN: Soft, non-tender, non-distended MUSCULOSKELETAL:  No edema; No deformity  SKIN: Warm and dry NEUROLOGIC:  Alert and oriented x 3 PSYCHIATRIC:  Normal affect   ASSESSMENT:    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 Has evidence of an inferior scar on his EKG.  - no active symptoms - start asa 81 mg daily and lipitor -TTE - patient wanted more assessment of his coronary arteries, will get a CAC score. Requested a stress, without symptoms, no strong indication.  MVP: TTE PLAN:    In order of problems listed above:  Fasting lipids 3 months  Asa 81 mg daily Lipitor 20 mg daily CAC score TTE Follow up 1 year  Medication Adjustments/Labs and Tests Ordered: Current medicines are reviewed at length with the patient today.  Concerns regarding medicines are outlined above.  Orders Placed This Encounter  Procedures   CT CARDIAC SCORING (SELF PAY ONLY)   Lipid panel   EKG 12-Lead   ECHOCARDIOGRAM COMPLETE   Meds ordered this encounter  Medications   aspirin EC 81 MG tablet    Sig: Take 1 tablet (81 mg total) by mouth daily. Swallow whole.    Dispense:  90 tablet    Refill:  3   atorvastatin (LIPITOR) 20 MG tablet    Sig: Take 1 tablet (20 mg total) by mouth daily.    Dispense:  90 tablet    Refill:  3    Patient Instructions  Medication Instructions:  START Aspirin 81 mg daily START Lipitor 20 mg daily  *If you need a refill on your cardiac medications before your next appointment, please call your  pharmacy*  Lab Work: Your physician recommends that you return for lab work in 3 months:  Fasting Lipid Panel-DO NOT eat or drink past midnight. Okay to have water and/or black coffee only   If you have labs (  blood work) drawn today and your tests are completely normal, you will receive your results only by: MyChart Message (if you have MyChart) OR A paper copy in the mail If you have any lab test that is abnormal or we need to change your treatment, we will call you to review the results.  Testing/Procedures: Your physician has requested that you have an echocardiogram. Echocardiography is a painless test that uses sound waves to create images of your heart. It provides your doctor with information about the size and shape of your heart and how well your heart's chambers and valves are working. This procedure takes approximately one hour. There are no restrictions for this procedure. Please do NOT wear cologne, perfume, aftershave, or lotions (deodorant is allowed). Please arrive 15 minutes prior to your appointment time.  Dr. Wyline Mood has ordered a CT coronary calcium score.   Test locations:  [facility]  [facility]  This is $99 out of pocket.   Coronary CalciumScan A coronary calcium scan is an imaging test used to look for deposits of calcium and other fatty materials (plaques) in the inner lining of the blood vessels of the heart (coronary arteries). These deposits of calcium and plaques can partly clog and narrow the coronary arteries without producing any symptoms or warning signs. This puts a person at risk for a heart attack. This test can detect these deposits before symptoms develop. Tell a health care provider about: Any allergies you have. All medicines you are taking, including vitamins, herbs, eye drops, creams, and over-the-counter medicines. Any problems you or family members  have had with anesthetic medicines. Any blood disorders you have. Any surgeries you have had. Any medical conditions you have. Whether you are pregnant or may be pregnant. What are the risks? Generally, this is a safe procedure. However, problems may occur, including: Harm to a pregnant woman and her unborn baby. This test involves the use of radiation. Radiation exposure can be dangerous to a pregnant woman and her unborn baby. If you are pregnant, you generally should not have this procedure done. Slight increase in the risk of cancer. This is because of the radiation involved in the test. What happens before the procedure? No preparation is needed for this procedure. What happens during the procedure? You will undress and remove any jewelry around your neck or chest. You will put on a hospital gown. Sticky electrodes will be placed on your chest. The electrodes will be connected to an electrocardiogram (ECG) machine to record a tracing of the electrical activity of your heart. A CT scanner will take pictures of your heart. During this time, you will be asked to lie still and hold your breath for 2-3 seconds while a picture of your heart is being taken. The procedure may vary among health care providers and hospitals. What happens after the procedure? You can get dressed. You can return to your normal activities. It is up to you to get the results of your test. Ask your health care provider, or the department that is doing the test, when your results will be ready. Summary A coronary calcium scan is an imaging test used to look for deposits of calcium and other fatty materials (plaques) in the inner lining of the blood vessels of the heart (coronary arteries). Generally, this is a safe procedure. Tell your health care provider if you are pregnant or may be pregnant. No preparation is needed  for this procedure. A CT scanner will take pictures of your heart. You can return to your normal  activities after the scan is done. This information is not intended to replace advice given to you by your health care provider. Make sure you discuss any questions you have with your health care provider. Document Released: 02/13/2008 Document Revised: 07/06/2016 Document Reviewed: 07/06/2016 Elsevier Interactive Patient Education  2017 [employer].   Follow-Up: At [facility], you and your health needs are our priority.  As part of our continuing mission to provide you with exceptional heart care, we have created designated Provider Care Teams.  These Care Teams include your primary Cardiologist (physician) and Advanced Practice Providers (APPs -  Physician Assistants and Nurse Practitioners) who all work together to provide you with the care you need, when you need it.  We recommend signing up for the patient portal called "MyChart".  Sign up information is provided on this After Visit Summary.  MyChart is used to connect with patients for Virtual Visits (Telemedicine).  Patients are able to view lab/test results, encounter notes, upcoming appointments, etc.  Non-urgent messages can be sent to your provider as well.   To learn more about what you can do with MyChart, go to [email or web address].    Your next appointment:   1 year(s)  Provider:   Maisie Fus, MD     Other Instructions     Signed, Maisie Fus, MD  12/29/2022 4:42 PM    [facility]

## 2022-12-29 NOTE — Patient Instructions (Addendum)
Medication Instructions:  START Aspirin 81 mg daily START Lipitor 20 mg daily  *If you need a refill on your cardiac medications before your next appointment, please call your pharmacy*  Lab Work: Your physician recommends that you return for lab work in 3 months:  Fasting Lipid Panel-DO NOT eat or drink past midnight. Okay to have water and/or black coffee only   If you have labs (blood work) drawn today and your tests are completely normal, you will receive your results only by: MyChart Message (if you have MyChart) OR A paper copy in the mail If you have any lab test that is abnormal or we need to change your treatment, we will call you to review the results.  Testing/Procedures: Your physician has requested that you have an echocardiogram. Echocardiography is a painless test that uses sound waves to create images of your heart. It provides your doctor with information about the size and shape of your heart and how well your heart's chambers and valves are working. This procedure takes approximately one hour. There are no restrictions for this procedure. Please do NOT wear cologne, perfume, aftershave, or lotions (deodorant is allowed). Please arrive 15 minutes prior to your appointment time.  Dr. Wyline Mood has ordered a CT coronary calcium score.   Test locations:  [facility]  [facility] at [facility]  This is $99 out of pocket.   Coronary CalciumScan A coronary calcium scan is an imaging test used to look for deposits of calcium and other fatty materials (plaques) in the inner lining of the blood vessels of the heart (coronary arteries). These deposits of calcium and plaques can partly clog and narrow the coronary arteries without producing any symptoms or warning signs. This puts a person at risk for a heart attack. This test can detect these deposits before symptoms develop. Tell a health care provider  about: Any allergies you have. All medicines you are taking, including vitamins, herbs, eye drops, creams, and over-the-counter medicines. Any problems you or family members have had with anesthetic medicines. Any blood disorders you have. Any surgeries you have had. Any medical conditions you have. Whether you are pregnant or may be pregnant. What are the risks? Generally, this is a safe procedure. However, problems may occur, including: Harm to a pregnant woman and her unborn baby. This test involves the use of radiation. Radiation exposure can be dangerous to a pregnant woman and her unborn baby. If you are pregnant, you generally should not have this procedure done. Slight increase in the risk of cancer. This is because of the radiation involved in the test. What happens before the procedure? No preparation is needed for this procedure. What happens during the procedure? You will undress and remove any jewelry around your neck or chest. You will put on a hospital gown. Sticky electrodes will be placed on your chest. The electrodes will be connected to an electrocardiogram (ECG) machine to record a tracing of the electrical activity of your heart. A CT scanner will take pictures of your heart. During this time, you will be asked to lie still and hold your breath for 2-3 seconds while a picture of your heart is being taken. The procedure may vary among health care providers and hospitals. What happens after the procedure? You can get dressed. You can return to your normal activities. It is up to you to get the results of your test. Ask your health care provider, or  the department that is doing the test, when your results will be ready. Summary A coronary calcium scan is an imaging test used to look for deposits of calcium and other fatty materials (plaques) in the inner lining of the blood vessels of the heart (coronary arteries). Generally, this is a safe procedure. Tell your health care  provider if you are pregnant or may be pregnant. No preparation is needed for this procedure. A CT scanner will take pictures of your heart. You can return to your normal activities after the scan is done. This information is not intended to replace advice given to you by your health care provider. Make sure you discuss any questions you have with your health care provider. Document Released: 02/13/2008 Document Revised: 07/06/2016 Document Reviewed: 07/06/2016 Elsevier Interactive Patient Education  2017 [employer].   Follow-Up: At [facility], you and your health needs are our priority.  As part of our continuing mission to provide you with exceptional heart care, we have created designated Provider Care Teams.  These Care Teams include your primary Cardiologist (physician) and Advanced Practice Providers (APPs -  Physician Assistants and Nurse Practitioners) who all work together to provide you with the care you need, when you need it.  We recommend signing up for the patient portal called "MyChart".  Sign up information is provided on this After Visit Summary.  MyChart is used to connect with patients for Virtual Visits (Telemedicine).  Patients are able to view lab/test results, encounter notes, upcoming appointments, etc.  Non-urgent messages can be sent to your provider as well.   To learn more about what you can do with MyChart, go to [email or web address].    Your next appointment:   1 year(s)  Provider:   Maisie Fus, MD     Other Instructions

## 2023-01-21 ENCOUNTER — Encounter (HOSPITAL_COMMUNITY): Payer: Self-pay | Admitting: Internal Medicine

## 2023-01-21 ENCOUNTER — Ambulatory Visit (HOSPITAL_BASED_OUTPATIENT_CLINIC_OR_DEPARTMENT_OTHER): Admission: RE | Admit: 2023-01-21 | Payer: Medicaid Other | Source: Ambulatory Visit

## 2023-01-21 ENCOUNTER — Ambulatory Visit (HOSPITAL_BASED_OUTPATIENT_CLINIC_OR_DEPARTMENT_OTHER)
Admission: RE | Admit: 2023-01-21 | Discharge: 2023-01-21 | Disposition: A | Discharge status: Home / Self Care | Payer: Medicaid Other | Source: Ambulatory Visit | Attending: Internal Medicine | Admitting: Internal Medicine

## 2023-01-21 DIAGNOSIS — R9431 Abnormal electrocardiogram [ECG] [EKG]: Secondary | ICD-10-CM | POA: Insufficient documentation

## 2023-02-01 ENCOUNTER — Telehealth: Payer: Self-pay | Admitting: Internal Medicine

## 2023-02-01 ENCOUNTER — Encounter: Payer: Self-pay | Admitting: *Deleted

## 2023-02-01 DIAGNOSIS — I25119 Atherosclerotic heart disease of native coronary artery with unspecified angina pectoris: Secondary | ICD-10-CM

## 2023-02-01 MED ORDER — METOPROLOL SUCCINATE ER 50 MG PO TB24: 50.0000 mg | ORAL_TABLET | Freq: Every day | ORAL | 3 refills | Status: DC

## 2023-02-01 NOTE — Telephone Encounter (Signed)
Notes not having significant CP, mainly wanted to get checked based on his family's hx. Will start with metoprolol XL 50 mg daily. Was not taking lipitor. Will plan for a FU in a month to review his medications and symptoms.

## 2023-02-12 DIAGNOSIS — M1611 Unilateral primary osteoarthritis, right hip: Secondary | ICD-10-CM | POA: Insufficient documentation

## 2023-02-16 ENCOUNTER — Ambulatory Visit (HOSPITAL_COMMUNITY)
Admission: RE | Admit: 2023-02-16 | Discharge: 2023-02-16 | Disposition: A | Discharge status: Home / Self Care | Payer: Medicaid Other | Source: Ambulatory Visit | Attending: Internal Medicine | Admitting: Internal Medicine

## 2023-02-16 DIAGNOSIS — I341 Nonrheumatic mitral (valve) prolapse: Secondary | ICD-10-CM | POA: Diagnosis not present

## 2023-02-16 DIAGNOSIS — I10 Essential (primary) hypertension: Secondary | ICD-10-CM | POA: Diagnosis not present

## 2023-02-16 DIAGNOSIS — I340 Nonrheumatic mitral (valve) insufficiency: Secondary | ICD-10-CM | POA: Diagnosis not present

## 2023-02-16 LAB — ECHOCARDIOGRAM COMPLETE
Area-P 1/2: 2.03 cm2
Calc EF: 46.9 %
S' Lateral: 2.8 cm
Single Plane A2C EF: 48.2 %
Single Plane A4C EF: 45.2 %

## 2023-02-16 NOTE — Progress Notes (Signed)
Echocardiogram 2D Echocardiogram has been performed.  Augustine Radar 02/16/2023, 1:56 PM

## 2023-02-17 ENCOUNTER — Telehealth: Payer: Self-pay | Admitting: Internal Medicine

## 2023-02-17 NOTE — Telephone Encounter (Signed)
Called Peter Graham about his echo , inferior wall appears hypokinetic , still having intermittent CP. We discussed doing LHC.    Shared Decision Making/Informed Consent The risks [stroke (1 in 1000), death (1 in 1000), kidney failure [usually temporary] (1 in 500), bleeding (1 in 200), allergic reaction [possibly serious] (1 in 200)], benefits (diagnostic support and management of coronary artery disease) and alternatives of a cardiac catheterization were discussed in detail with Peter Graham and he is willing to proceed.

## 2023-02-17 NOTE — Progress Notes (Unsigned)
Cardiology Clinic Note   Date: 02/18/2023 ID: Peter Graham, DOB 01/10/1966, MRN [id number]  Primary Cardiologist:  Maisie Fus, MD  Patient Profile    Peter Graham is a 57 y.o. male who presents to the clinic today to be set up for [facility].     Past medical history significant for: Chest pain.  CT cardiac scoring 01/21/2023: Coronary calcium score 581 (95th percentile). Mitral valve regurgitation.  Echo 02/16/2023: EF 45 to 50%.  Mildly reduced RV function.  Mitral valve is normal in structure, mild MR.  Trivial AI. Hypertension.  Substance abuse disorder.      History of Present Illness    Peter Graham was first evaluated by Dr. Wyline Mood on 12/29/2022 following an ED visit for chest pain in February 2024. Patient presented to the ED on 27/2024 for chest pain that began at 2am and lasted for about 2 hours. Pain was relieved with clonazepam. Pain was described as tightness across chest with associated heart racing and shortness of breath. Similar to past panic attacks. Troponin negative x 2. D dimer slightly elevated at 0.59. EKG without signs of active ischemia. CTA chest was negative for PE. He was discharged home to follow up with cardiology as an outpatient. He was pain free at the time of his visit with Dr. Wyline Mood. CT cardiac scoring showed calcium of 581. Echo showed EF 45-50%, mildly reduced RV function. Dr. Wyline Mood reviewed echo on 02/17/2023 and noted inferior wall hypokinesis. Patient reported continued intermittent chest pain. Agreed to proceed with LHC.   Today, patient reports continued episodes of a "funny feeling" in his chest described as a squeezing type pain that occurs when he is anxious. He also reports some dyspnea with heavier exertion and he feels he fatigues easier than previous. He is on disability but stays busy doing yard work, cutting down trees, and other outdoor activities. His biggest concern is right foot/toe pain that has been a chronic issue for him but has worsened  over the last year. He has tried multiple medications including allopurinol, colchicine, and gabapentin. Encouraged patient to follow up with PCP. Patient agrees with proceeding with LHC. All questions answered.     ROS: All other systems reviewed and are otherwise negative except as noted in History of Present Illness.  Studies Reviewed    EKG Interpretation  Date/Time:  Thursday February 18 2023 10:21:29 EDT Ventricular Rate:  66 PR Interval:  214 QRS Duration: 90 QT Interval:  400 QTC Calculation: 419 R Axis:   38 Text Interpretation: Sinus rhythm with 1st degree A-V block Inferior infarct , age undetermined When compared with ECG of 07-Oct-2022 12:18, PREVIOUS ECG IS PRESENT Confirmed by Peter Graham) on 02/18/2023 11:18:53 AM            Physical Exam    VS:  BP 134/80 (BP Location: Left Arm, Patient Position: Sitting, Cuff Size: Large)   Pulse 66   Ht 6\' 5"  (1.956 m)   Wt 221 lb (100.2 kg)   SpO2 95%   BMI 26.21 kg/m  , BMI Body mass index is 26.21 kg/m.  GEN: Well nourished, well developed, in no acute distress. Neck: No JVD or carotid bruits. Cardiac:  RRR. No murmurs. No rubs or gallops.   Respiratory:  Respirations regular and unlabored. Clear to auscultation without rales, wheezing or rhonchi. GI: Soft, nontender, nondistended. Extremities: Radials/DP/PT 2+ and equal bilaterally. No clubbing or cyanosis. No edema.  Skin: Warm and dry, no rash. Neuro: Strength intact.  Assessment & Plan    Chest pain. Patient reported to ED in February 2024 for 2 hours of chest pain.  He had a negative workup but was referred to cardiology.  CT cardiac scoring May 2024 showed calcium score of 581.  On Dr. Henreitta Leber review of echocardiogram she noted inferior wall hypokinesis, EF 45 to 50%, mildly reduced RV function.  Patient reports continued squeezing type sensation in middle of chest particularly with stress. He also has some dyspnea with heavy exertion and easy fatigue.  Continue aspirin, atorvastatin, metoprolol.  Patient agrees to proceed with LHC.  Hypertension: BP today 134/80. Patient denies headaches, dizziness or vision changes. Continue metoprolol.  Disposition: LHC.  CBC and BMP today.  Return in 3 weeks or sooner as needed.     Informed Consent   Shared Decision Making/Informed Consent The risks [stroke (1 in 1000), death (1 in 1000), kidney failure [usually temporary] (1 in 500), bleeding (1 in 200), allergic reaction [possibly serious] (1 in 200)], benefits (diagnostic support and management of coronary artery disease) and alternatives of a cardiac catheterization were discussed in detail with Peter Graham and he is willing to proceed.      Signed, Etta Grandchild. Mate Alegria, DNP, NP-C

## 2023-02-17 NOTE — H&P (View-Only) (Signed)
Cardiology Clinic Note   Date: 02/18/2023 ID: Peter Graham, DOB 01/10/1966, MRN [id number]  Primary Cardiologist:  Maisie Fus, MD  Patient Profile    Peter Graham is a 57 y.o. male who presents to the clinic today to be set up for [facility].     Past medical history significant for: Chest pain.  CT cardiac scoring 01/21/2023: Coronary calcium score 581 (95th percentile). Mitral valve regurgitation.  Echo 02/16/2023: EF 45 to 50%.  Mildly reduced RV function.  Mitral valve is normal in structure, mild MR.  Trivial AI. Hypertension.  Substance abuse disorder.      History of Present Illness    Peter Graham was first evaluated by Dr. Wyline Mood on 12/29/2022 following an ED visit for chest pain in February 2024. Patient presented to the ED on 27/2024 for chest pain that began at 2am and lasted for about 2 hours. Pain was relieved with clonazepam. Pain was described as tightness across chest with associated heart racing and shortness of breath. Similar to past panic attacks. Troponin negative x 2. D dimer slightly elevated at 0.59. EKG without signs of active ischemia. CTA chest was negative for PE. He was discharged home to follow up with cardiology as an outpatient. He was pain free at the time of his visit with Dr. Wyline Mood. CT cardiac scoring showed calcium of 581. Echo showed EF 45-50%, mildly reduced RV function. Dr. Wyline Mood reviewed echo on 02/17/2023 and noted inferior wall hypokinesis. Patient reported continued intermittent chest pain. Agreed to proceed with LHC.   Today, patient reports continued episodes of a "funny feeling" in his chest described as a squeezing type pain that occurs when he is anxious. He also reports some dyspnea with heavier exertion and he feels he fatigues easier than previous. He is on disability but stays busy doing yard work, cutting down trees, and other outdoor activities. His biggest concern is right foot/toe pain that has been a chronic issue for him but has worsened  over the last year. He has tried multiple medications including allopurinol, colchicine, and gabapentin. Encouraged patient to follow up with PCP. Patient agrees with proceeding with LHC. All questions answered.     ROS: All other systems reviewed and are otherwise negative except as noted in History of Present Illness.  Studies Reviewed    EKG Interpretation  Date/Time:  Thursday February 18 2023 10:21:29 EDT Ventricular Rate:  66 PR Interval:  214 QRS Duration: 90 QT Interval:  400 QTC Calculation: 419 R Axis:   38 Text Interpretation: Sinus rhythm with 1st degree A-V block Inferior infarct , age undetermined When compared with ECG of 07-Oct-2022 12:18, PREVIOUS ECG IS PRESENT Confirmed by Peter Graham [phone]) on 02/18/2023 11:18:53 AM            Physical Exam    VS:  BP 134/80 (BP Location: Left Arm, Patient Position: Sitting, Cuff Size: Large)   Pulse 66   Ht 6\' 5"  (1.956 m)   Wt 221 lb (100.2 kg)   SpO2 95%   BMI 26.21 kg/m  , BMI Body mass index is 26.21 kg/m.  GEN: Well nourished, well developed, in no acute distress. Neck: No JVD or carotid bruits. Cardiac:  RRR. No murmurs. No rubs or gallops.   Respiratory:  Respirations regular and unlabored. Clear to auscultation without rales, wheezing or rhonchi. GI: Soft, nontender, nondistended. Extremities: Radials/DP/PT 2+ and equal bilaterally. No clubbing or cyanosis. No edema.  Skin: Warm and dry, no rash. Neuro: Strength intact.  Assessment & Plan    Chest pain. Patient reported to ED in February 2024 for 2 hours of chest pain.  He had a negative workup but was referred to cardiology.  CT cardiac scoring May 2024 showed calcium score of 581.  On Dr. Henreitta Leber review of echocardiogram she noted inferior wall hypokinesis, EF 45 to 50%, mildly reduced RV function.  Patient reports continued squeezing type sensation in middle of chest particularly with stress. He also has some dyspnea with heavy exertion and easy fatigue.  Continue aspirin, atorvastatin, metoprolol.  Patient agrees to proceed with LHC.  Hypertension: BP today 134/80. Patient denies headaches, dizziness or vision changes. Continue metoprolol.  Disposition: LHC.  CBC and BMP today.  Return in 3 weeks or sooner as needed.     Informed Consent   Shared Decision Making/Informed Consent The risks [stroke (1 in 1000), death (1 in 1000), kidney failure [usually temporary] (1 in 500), bleeding (1 in 200), allergic reaction [possibly serious] (1 in 200)], benefits (diagnostic support and management of coronary artery disease) and alternatives of a cardiac catheterization were discussed in detail with Peter Graham and he is willing to proceed.      Signed, Etta Grandchild. Kiven Vangilder, DNP, NP-C

## 2023-02-18 ENCOUNTER — Encounter: Payer: Self-pay | Admitting: Student

## 2023-02-18 ENCOUNTER — Ambulatory Visit: Payer: Medicaid Other | Attending: Student | Admitting: Student

## 2023-02-18 VITALS — BP 134/80 | HR 66 | Ht 77.0 in | Wt 221.0 lb

## 2023-02-18 DIAGNOSIS — R0789 Other chest pain: Secondary | ICD-10-CM

## 2023-02-18 DIAGNOSIS — I10 Essential (primary) hypertension: Secondary | ICD-10-CM

## 2023-02-18 MED ORDER — SODIUM CHLORIDE 0.9% FLUSH
3.0000 mL | Freq: Two times a day (BID) | INTRAVENOUS | Status: AC
Start: 2023-02-18 — End: ?

## 2023-02-18 NOTE — Patient Instructions (Addendum)
Medication Instructions:  Your physician recommends that you continue on your current medications as directed. Please refer to the Current Medication list given to you today.  *If you need a refill on your cardiac medications before your next appointment, please call your pharmacy*   Lab Work: BMET & CBC today  Testing/Procedures: Your physician has requested that you have a cardiac catheterization. Cardiac catheterization is used to diagnose and/or treat various heart conditions. Doctors may recommend this procedure for a number of different reasons. The most common reason is to evaluate chest pain. Chest pain can be a symptom of coronary artery disease (CAD), and cardiac catheterization can show whether plaque is narrowing or blocking your heart's arteries. This procedure is also used to evaluate the valves, as well as measure the blood flow and oxygen levels in different parts of your heart. For further information please visit [email or web address] Please follow instruction sheet, as given.    Follow-Up: At [facility], you and your health needs are our priority.  As part of our continuing mission to provide you with exceptional heart care, we have created designated Provider Care Teams.  These Care Teams include your primary Cardiologist (physician) and Advanced Practice Providers (APPs -  Physician Assistants and Nurse Practitioners) who all work together to provide you with the care you need, when you need it.  We recommend signing up for the patient portal called "MyChart".  Sign up information is provided on this After Visit Summary.  MyChart is used to connect with patients for Virtual Visits (Telemedicine).  Patients are able to view lab/test results, encounter notes, upcoming appointments, etc.  Non-urgent messages can be sent to your provider as well.   To learn more about what you can do with MyChart, go to [email or web address].    Your next appointment:   Keep follow  up  Provider:   Dr. Wyline Mood    Other Instructions  [facility] A DEPT OF [place]. [facility] AT [facility] [id number] [place] [address] Dept: [phone] Loc: [phone]  Anastasia Dehoog  02/18/2023  You are scheduled for a Cardiac Catheterization on Thursday, June 25 with Dr. Nicki Guadalajara.  1. Please arrive at the [facility] (Main Entrance A) at [facility]: [address] [place], [place] [address] at 7:00 AM (This time is 2 hour(s) before your procedure to ensure your preparation). Free valet parking service is available. You will check in at ADMITTING. The support person will be asked to wait in the waiting room.  It is OK to have someone drop you off and come back when you are ready to be discharged.    Special note: Every effort is made to have your procedure done on time. Please understand that emergencies sometimes delay scheduled procedures.  2. Diet: Do not eat solid foods after midnight.  The patient may have clear liquids until 5am upon the day of the procedure.  3. Labs: You will need to have blood drawn on , June 20 at [facility] [address], [place]  Open: 8am - 5pm (Lunch 12:30 - 1:30)   Phone: [phone]. You do not need to be fasting.  4. Medication instructions in preparation for your procedure:   Contrast Allergy: No    Current Outpatient Medications (Cardiovascular):    atorvastatin (LIPITOR) 20 MG tablet, Take 1 tablet (20 mg total) by mouth daily.   metoprolol succinate (TOPROL-XL) 50 MG 24 hr tablet, Take  1 tablet (50 mg total) by mouth daily. Take with or immediately following a meal.   tadalafil (CIALIS) 5 MG tablet, Take 5 mg by mouth daily as needed.   Current Outpatient Medications (Analgesics):    aspirin EC 81 MG tablet, Take 1 tablet (81 mg total) by mouth daily. Swallow whole.   Current Outpatient Medications (Other):     clonazePAM (KLONOPIN) 1 MG tablet, Take 1 mg by mouth 3 (three) times daily.   gabapentin (NEURONTIN) 300 MG capsule, Take 300 mg by mouth 3 (three) times daily.   gabapentin (NEURONTIN) 600 MG tablet, Take 600 mg by mouth 3 (three) times daily as needed.   tamsulosin (FLOMAX) 0.4 MG CAPS capsule, Take 0.4 mg by mouth daily. *For reference purposes while preparing patient instructions.   Delete this med list prior to printing instructions for patient.*  Hold Cialis 24 hours prior   On the morning of your procedure, take your Aspirin 81 mg and any morning medicines NOT listed above.  You may use sips of water.  5. Plan to go home the same day, you will only stay overnight if medically necessary. 6. Bring a current list of your medications and current insurance cards. 7. You MUST have a responsible person to drive you home. 8. Someone MUST be with you the first 24 hours after you arrive home or your discharge will be delayed. 9. Please wear clothes that are easy to get on and off and wear slip-on shoes.  Thank you for allowing [place] to care for you!   --  Invasive Cardiovascular services

## 2023-02-19 LAB — CBC
Hematocrit: 47.6 % (ref 37.5–51.0)
Hemoglobin: 15.9 g/dL (ref 13.0–17.7)
MCH: 32.1 pg (ref 26.6–33.0)
MCHC: 33.4 g/dL (ref 31.5–35.7)
MCV: 96 fL (ref 79–97)
Platelets: 213 10*3/uL (ref 150–450)
RBC: 4.96 x10E6/uL (ref 4.14–5.80)
RDW: 13.2 % (ref 11.6–15.4)
WBC: 7 10*3/uL (ref 3.4–10.8)

## 2023-02-19 LAB — BASIC METABOLIC PANEL
BUN/Creatinine Ratio: 16 (ref 9–20)
BUN: 16 mg/dL (ref 6–24)
CO2: 26 mmol/L (ref 20–29)
Calcium: 10.1 mg/dL (ref 8.7–10.2)
Chloride: 102 mmol/L (ref 96–106)
Creatinine, Ser: 0.99 mg/dL (ref 0.76–1.27)
Glucose: 104 mg/dL — ABNORMAL HIGH (ref 70–99)
Potassium: 5.3 mmol/L — ABNORMAL HIGH (ref 3.5–5.2)
Sodium: 147 mmol/L — ABNORMAL HIGH (ref 134–144)
eGFR: 88 mL/min/{1.73_m2} (ref >59)

## 2023-02-23 ENCOUNTER — Ambulatory Visit (HOSPITAL_COMMUNITY)
Admission: RE | Admit: 2023-02-23 | Discharge: 2023-02-23 | Disposition: A | Discharge status: Home / Self Care | Payer: Medicaid Other | Attending: Cardiovascular Disease | Admitting: Cardiovascular Disease

## 2023-02-23 ENCOUNTER — Telehealth: Payer: Self-pay

## 2023-02-23 ENCOUNTER — Other Ambulatory Visit: Payer: Self-pay

## 2023-02-23 ENCOUNTER — Encounter (HOSPITAL_COMMUNITY)
Admission: RE | Disposition: A | Discharge status: Home / Self Care | Payer: Self-pay | Source: Home / Self Care | Attending: Cardiovascular Disease

## 2023-02-23 DIAGNOSIS — Z7982 Long term (current) use of aspirin: Secondary | ICD-10-CM | POA: Insufficient documentation

## 2023-02-23 DIAGNOSIS — E785 Hyperlipidemia, unspecified: Secondary | ICD-10-CM | POA: Insufficient documentation

## 2023-02-23 DIAGNOSIS — I2510 Atherosclerotic heart disease of native coronary artery without angina pectoris: Secondary | ICD-10-CM | POA: Diagnosis not present

## 2023-02-23 DIAGNOSIS — R0609 Other forms of dyspnea: Secondary | ICD-10-CM | POA: Insufficient documentation

## 2023-02-23 DIAGNOSIS — Z79899 Other long term (current) drug therapy: Secondary | ICD-10-CM | POA: Insufficient documentation

## 2023-02-23 DIAGNOSIS — R079 Chest pain, unspecified: Secondary | ICD-10-CM | POA: Insufficient documentation

## 2023-02-23 DIAGNOSIS — I10 Essential (primary) hypertension: Secondary | ICD-10-CM | POA: Insufficient documentation

## 2023-02-23 DIAGNOSIS — R0789 Other chest pain: Secondary | ICD-10-CM

## 2023-02-23 DIAGNOSIS — I340 Nonrheumatic mitral (valve) insufficiency: Secondary | ICD-10-CM | POA: Insufficient documentation

## 2023-02-23 HISTORY — PX: LEFT HEART CATH AND CORONARY ANGIOGRAPHY: CATH118249

## 2023-02-23 SURGERY — LEFT HEART CATH AND CORONARY ANGIOGRAPHY
Anesthesia: LOCAL

## 2023-02-23 MED ORDER — HEPARIN SODIUM (PORCINE) 1000 UNIT/ML IJ SOLN
INTRAMUSCULAR | Status: AC
  Filled 2023-02-23: qty 10

## 2023-02-23 MED ORDER — SODIUM CHLORIDE 0.9 % WEIGHT BASED INFUSION: 1.0000 mL/kg/h | INTRAVENOUS | Status: DC

## 2023-02-23 MED ORDER — ASPIRIN 81 MG PO CHEW: 81.0000 mg | CHEWABLE_TABLET | Freq: Every day | ORAL | Status: DC

## 2023-02-23 MED ORDER — SODIUM CHLORIDE 0.9 % IV SOLN: 250.0000 mL | INTRAVENOUS | Status: DC | PRN

## 2023-02-23 MED ORDER — HYDRALAZINE HCL 20 MG/ML IJ SOLN: 10.0000 mg | INTRAMUSCULAR | Status: DC | PRN

## 2023-02-23 MED ORDER — HEPARIN (PORCINE) IN NACL 1000-0.9 UT/500ML-% IV SOLN
INTRAVENOUS | Status: DC | PRN
  Administered 2023-02-23 (×2): 500 mL via INTRA_ARTERIAL

## 2023-02-23 MED ORDER — LIDOCAINE HCL (PF) 1 % IJ SOLN
INTRAMUSCULAR | Status: DC | PRN
  Administered 2023-02-23: 5 mL

## 2023-02-23 MED ORDER — HEPARIN SODIUM (PORCINE) 1000 UNIT/ML IJ SOLN
INTRAMUSCULAR | Status: DC | PRN
  Administered 2023-02-23: 5000 [IU] via INTRAVENOUS

## 2023-02-23 MED ORDER — SODIUM CHLORIDE 0.9 % IV SOLN: INTRAVENOUS | Status: DC

## 2023-02-23 MED ORDER — LIDOCAINE HCL (PF) 1 % IJ SOLN
INTRAMUSCULAR | Status: AC
  Filled 2023-02-23: qty 30

## 2023-02-23 MED ORDER — MIDAZOLAM HCL 2 MG/2ML IJ SOLN
INTRAMUSCULAR | Status: DC | PRN
  Administered 2023-02-23: 2 mg via INTRAVENOUS

## 2023-02-23 MED ORDER — MIDAZOLAM HCL 2 MG/2ML IJ SOLN
INTRAMUSCULAR | Status: AC
  Filled 2023-02-23: qty 2

## 2023-02-23 MED ORDER — VERAPAMIL HCL 2.5 MG/ML IV SOLN
INTRAVENOUS | Status: AC
  Filled 2023-02-23: qty 2

## 2023-02-23 MED ORDER — LABETALOL HCL 5 MG/ML IV SOLN: 10.0000 mg | INTRAVENOUS | Status: DC | PRN

## 2023-02-23 MED ORDER — SODIUM CHLORIDE 0.9% FLUSH: 3.0000 mL | INTRAVENOUS | Status: DC | PRN

## 2023-02-23 MED ORDER — OXYCODONE-ACETAMINOPHEN 5-325 MG PO TABS
1.0000 | ORAL_TABLET | Freq: Once | ORAL | Status: AC
  Administered 2023-02-23: 1 via ORAL
  Filled 2023-02-23: qty 1

## 2023-02-23 MED ORDER — FENTANYL CITRATE (PF) 100 MCG/2ML IJ SOLN
INTRAMUSCULAR | Status: DC | PRN
  Administered 2023-02-23: 50 ug via INTRAVENOUS

## 2023-02-23 MED ORDER — ONDANSETRON HCL 4 MG/2ML IJ SOLN: 4.0000 mg | Freq: Four times a day (QID) | INTRAMUSCULAR | Status: DC | PRN

## 2023-02-23 MED ORDER — VERAPAMIL HCL 2.5 MG/ML IV SOLN
INTRAVENOUS | Status: DC | PRN
  Administered 2023-02-23: 10 mL via INTRA_ARTERIAL

## 2023-02-23 MED ORDER — SODIUM CHLORIDE 0.9 % WEIGHT BASED INFUSION
3.0000 mL/kg/h | INTRAVENOUS | Status: AC
  Administered 2023-02-23: 3 mL/kg/h via INTRAVENOUS

## 2023-02-23 MED ORDER — FENTANYL CITRATE (PF) 100 MCG/2ML IJ SOLN
INTRAMUSCULAR | Status: AC
  Filled 2023-02-23: qty 2

## 2023-02-23 MED ORDER — SODIUM CHLORIDE 0.9% FLUSH: 3.0000 mL | Freq: Two times a day (BID) | INTRAVENOUS | Status: DC

## 2023-02-23 MED ORDER — IOHEXOL 350 MG/ML SOLN
INTRAVENOUS | Status: DC | PRN
  Administered 2023-02-23: 75 mL via INTRA_ARTERIAL

## 2023-02-23 MED ORDER — ACETAMINOPHEN 325 MG PO TABS
650.0000 mg | ORAL_TABLET | ORAL | Status: DC | PRN
  Administered 2023-02-23: 650 mg via ORAL
  Filled 2023-02-23: qty 2

## 2023-02-23 MED ORDER — ASPIRIN 81 MG PO CHEW: 81.0000 mg | CHEWABLE_TABLET | ORAL | Status: DC

## 2023-02-23 SURGICAL SUPPLY — 13 items
CATH INFINITI 5 FR JL3.5 (CATHETERS) IMPLANT
CATH INFINITI 5FR JL4 (CATHETERS) IMPLANT
CATH INFINITI AMBI 5FR TG (CATHETERS) IMPLANT
CATH INFINITI JR4 5F (CATHETERS) IMPLANT
DEVICE RAD COMP TR BAND LRG (VASCULAR PRODUCTS) IMPLANT
GLIDESHEATH SLEND SS 6F .021 (SHEATH) IMPLANT
GUIDEWIRE INQWIRE 1.5J.035X260 (WIRE) IMPLANT
INQWIRE 1.5J .035X260CM (WIRE) ×1
KIT HEART LEFT (KITS) ×1 IMPLANT
PACK CARDIAC CATHETERIZATION (CUSTOM PROCEDURE TRAY) ×1 IMPLANT
SHEATH PROBE COVER 6X72 (BAG) IMPLANT
TRANSDUCER W/STOPCOCK (MISCELLANEOUS) ×1 IMPLANT
TUBING CIL FLEX 10 FLL-RA (TUBING) ×1 IMPLANT

## 2023-02-23 NOTE — Interval H&P Note (Signed)
Cath Lab Visit (complete for each Cath Lab visit)  Clinical Evaluation Leading to the Procedure:   ACS: No.  Non-ACS:    Anginal Classification: CCS II  Anti-ischemic medical therapy: Minimal Therapy (1 class of medications)  Non-Invasive Test Results: Intermediate-risk stress test findings: cardiac mortality 1-3%/year  Prior CABG: No previous CABG      History and Physical Interval Note:  02/23/2023 9:09 AM  Peter Graham  has presented today for surgery, with the diagnosis of CHEST PAIN.  The various methods of treatment have been discussed with the patient and family. After consideration of risks, benefits and other options for treatment, the patient has consented to  Procedure(s): LEFT HEART CATH AND CORONARY ANGIOGRAPHY (N/A) as a surgical intervention.  The patient's history has been reviewed, patient examined, no change in status, stable for surgery.  I have reviewed the patient's chart and labs.  Questions were answered to the patient's satisfaction.     Nicki Guadalajara

## 2023-02-23 NOTE — Telephone Encounter (Addendum)
Left voice message for patient to give office a call back for results. Will try calling again   ----- Message from Peter Levering, NP sent at 02/22/2023  2:19 PM EDT ----- Please let patient know kidney function is normal. Potassium is slightly elevated. Limit foods high in potassium such as potatoes, sweet potatoes, citrus fruits/juices, dried fruits.  Normal blood counts.   Thank you!  DW

## 2023-02-23 NOTE — Discharge Instructions (Signed)

## 2023-02-24 ENCOUNTER — Encounter (HOSPITAL_COMMUNITY): Payer: Self-pay | Admitting: Cardiovascular Disease

## 2023-02-24 ENCOUNTER — Telehealth: Payer: Self-pay | Admitting: Internal Medicine

## 2023-02-24 NOTE — Telephone Encounter (Signed)
Pt was returning call regarding his results and also wanted to discuss him not hearing anything after having his ablation done yesterday so he's unsure of how and when to change the bandage etc. Please advise

## 2023-02-24 NOTE — Telephone Encounter (Signed)
Patient ask regarding dressing removal. Once discussed post care instructions,. He tells me he knows all that.  But due to not knowing dressing change, I did re-iterate instructions. Lab results given while on the phone.  He states understanding.  -------He asked for Medication changes.  He states he was told there might be a med change based on the results of his heart cath.  Please advise if any changes at this time.

## 2023-02-24 NOTE — Telephone Encounter (Signed)
No medication changes at this time. His EF is above 40%. Full goal directed therapy is indicated in EF<40%.  MB

## 2023-02-24 NOTE — Telephone Encounter (Signed)
Called an spoke to patient  message given to  patient per Dr Wyline Mood response.  Patient  still have question about whether he would need medication, per patient the doctor who did his procedure indicated he may need medications.  RN informed patient will need to discuss with Dr Wyline Mood at next appointment 03/09/23.  Informed patient  it will be okay to take bandage from procedure yesterday.

## 2023-03-09 ENCOUNTER — Ambulatory Visit: Payer: MEDICAID | Attending: Internal Medicine | Admitting: Internal Medicine

## 2023-03-09 ENCOUNTER — Encounter: Payer: Self-pay | Admitting: Internal Medicine

## 2023-03-09 VITALS — BP 136/82 | HR 87 | Ht 73.0 in | Wt 219.6 lb

## 2023-03-09 DIAGNOSIS — I25119 Atherosclerotic heart disease of native coronary artery with unspecified angina pectoris: Secondary | ICD-10-CM | POA: Diagnosis not present

## 2023-03-09 NOTE — Progress Notes (Signed)
Cardiology Office Note:    Date:  03/09/2023   ID:  Peter Graham, DOB 1965/10/11, MRN [id number]  PCP:  System, Provider Not In   [facility] Providers Cardiologist:  Maisie Fus, MD     Referring MD: No ref. provider found   No chief complaint on file. CVD risk assesment   History of Present Illness:    Peter Graham is a 57 y.o. male with a hx of HTN, ?MVP, substance abuse disorder, who presented to the ED with CP in Feb 2024.   Per ED note: "Pt reports that he had an acute onset of chest pain that started around 2 am today. It lasted for 1.5 hours, tightness feeling across his chest with associated heart racing and shortness of breath. He denies nausea, vomiting, dizziness, extremity weakness or numbness. Denies recent travel or surgery, hx of cancer, cough, fever. He reports taking clonazepam which helped. He states he had panic attack before which was similar. " EKG showed sinus rhythm, inferior Q. Troponin was negative He's had multiple ED visits.  Today, he denies CP.  No SOB. No DM2. He works outside. He has chronic joint pain.   No orthopnea/PND.   Smoked cigarettes , many years  Father died of CVA, he had prior stents. Uncles had heart disease.  Blood pressure is well controlled  The 10-year ASCVD risk score (Arnett DK, et al., 2019) is: 13.8%   Values used to calculate the score:     Age: 33 years     Sex: Male     Is Non-Hispanic African American: No     Diabetic: No     Tobacco smoker: No     Systolic Blood Pressure: 136 mmHg     Is BP treated: Yes     HDL Cholesterol: 32 mg/dL     Total Cholesterol: 209 mg/dL   Interim hx 4/0/9811    Past Medical History:  Diagnosis Date   Hypertension     Past Surgical History:  Procedure Laterality Date   HERNIA REPAIR     LEFT HEART CATH AND CORONARY ANGIOGRAPHY N/A 02/23/2023   Procedure: LEFT HEART CATH AND CORONARY ANGIOGRAPHY;  Surgeon: Lennette Bihari, MD;  Location: [facility] INVASIVE CV LAB;   Service: Cardiovascular;  Laterality: N/A;   NASAL SEPTUM SURGERY      Current Medications: Current Outpatient Medications on File Prior to Visit  Medication Sig Dispense Refill   aspirin EC 81 MG tablet Take 1 tablet (81 mg total) by mouth daily. Swallow whole. 90 tablet 3   atorvastatin (LIPITOR) 20 MG tablet Take 1 tablet (20 mg total) by mouth daily. (Patient taking differently: Take 10 mg by mouth daily.) 90 tablet 3   clonazePAM (KLONOPIN) 1 MG tablet Take 1 mg by mouth 3 (three) times daily.     gabapentin (NEURONTIN) 300 MG capsule Take 600 mg by mouth 3 (three) times daily.     metoprolol succinate (TOPROL-XL) 50 MG 24 hr tablet Take 1 tablet (50 mg total) by mouth daily. Take with or immediately following a meal. (Patient taking differently: Take 25 mg by mouth daily. Take with or immediately following a meal.) 90 tablet 3   tadalafil (CIALIS) 5 MG tablet Take 5 mg by mouth daily as needed for erectile dysfunction.     Vitamin D, Ergocalciferol, (DRISDOL) 1.25 MG (50000 UNIT) CAPS capsule Take 50,000 Units by mouth once a week.     cholecalciferol (VITAMIN D3) 25 MCG (1000 UNIT) tablet Take  1,000 Units by mouth daily. (Patient not taking: Reported on 03/09/2023)     Current Facility-Administered Medications on File Prior to Visit  Medication Dose Route Frequency Provider Last Rate Last Admin   sodium chloride flush (NS) 0.9 % injection 3 mL  3 mL Intravenous Q12H Carlos Levering, NP         Allergies:   Patient has no known allergies.   Social History   Socioeconomic History   Marital status: Single    Spouse name: Not on file   Number of children: Not on file   Years of education: Not on file   Highest education level: Not on file  Occupational History   Not on file  Tobacco Use   Smoking status: Former    Packs/day: 1.00    Years: 20.00    Additional pack years: 0.00    Total pack years: 20.00    Types: Cigarettes    Quit date: 08/31/2021    Years since quitting:  1.5   Smokeless tobacco: Not on file  Substance and Sexual Activity   Alcohol use: No   Drug use: Not Currently    Types: IV   Sexual activity: Not on file  Other Topics Concern   Not on file  Social History Narrative   Not on file   Social Determinants of Health   Financial Resource Strain: Not on file  Food Insecurity: Not on file  Transportation Needs: Not on file  Physical Activity: Not on file  Stress: Not on file  Social Connections: Not on file     Family History: Per above  ROS:   Please see the history of present illness.     All other systems reviewed and are negative.  EKGs/Labs/Other Studies Reviewed:    The following studies were reviewed today:  TTE 02/16/2023 EF 45-50% RV fxn is mildy reduced Mild MR   LHC 02/23/2023   Prox LAD lesion is 20% stenosed.   Prox RCA lesion is 20% stenosed.   There is mild left ventricular systolic dysfunction.   Recommend Aspirin 81mg  daily for moderate CAD.   Mild nonobstructive CAD with mild calcification.   There is mild smooth 20% proximal LAD narrowing and 20% proximal RCA narrowing.   Normal left circumflex coronary artery.   Mild LV dysfunction with EF estimated at approximately 40 to 45% with mild hypocontractility involving the mid distal anterolateral wall extending to the apex.  LVEDP 11 mmHg.   RECOMMENDATION: Guideline directed medical therapy for reduced LV function.  Consider outpatient initiation of Entresto, Farxiga, continue metoprolol succinate and possible aldosterone blockade.    EKG:  EKG is  ordered today.  The ekg ordered today demonstrates   12/28/2021- NSR, inferior Q waves  Recent Labs: 02/18/2023: BUN 16; Creatinine, Ser 0.99; Hemoglobin 15.9; Platelets 213; Potassium 5.3; Sodium 147   Recent Lipid Panel No results found for: "CHOL", "TRIG", "HDL", "CHOLHDL", "VLDL", "LDLCALC", "LDLDIRECT"   Risk Assessment/Calculations:     Physical Exam:    VS:   Vitals:   03/09/23 1552   BP: 136/82  Pulse: 87  SpO2: 92%     Wt Readings from Last 3 Encounters:  03/09/23 219 lb 9.6 oz (99.6 kg)  02/23/23 220 lb (99.8 kg)  02/18/23 221 lb (100.2 kg)     GEN:  Well nourished, well developed in no acute distress HEENT: Normal NECK: No JVD; CARDIAC: RRR, no murmurs, rubs, gallops RESPIRATORY:  Clear to auscultation without rales, wheezing or rhonchi  ABDOMEN:  Soft, non-tender, non-distended MUSCULOSKELETAL:  No edema; No deformity  SKIN: Warm and dry NEUROLOGIC:  Alert and oriented x 3 PSYCHIATRIC:  Normal affect   ASSESSMENT:   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 Has evidence of an inferior scar on his EKG. TTE did not show WMA.  Had sig CAC; CAC score 581, 95th percentile. LHC showed non obstructive disease - continue asa 81 mg daily and lipitor daily -EF mildly reduced, no WMA - cont. BB ( had fatigue, reduced to 25 mg daily); no strong indication with non obstructive dx; we discussed he can stop this   Pre-Op: he is acceptable cardiac risk for hip surgery.   PLAN:    In order of problems listed above:  Fasting lipids pending, LDL goal < 70 mg/dL Follow up 1 year  Medication Adjustments/Labs and Tests Ordered: Current medicines are reviewed at length with the patient today.  Concerns regarding medicines are outlined above.  No orders of the defined types were placed in this encounter.  No orders of the defined types were placed in this encounter.   Patient Instructions  Medication Instructions:  No changes *If you need a refill on your cardiac medications before your next appointment, please call your pharmacy*   Lab Work: Fasting Lipid panel- Please return for Blood Work when it is convenient. No appointment needed, lab here at the office is open Monday-Friday from 8AM to 4PM.  You will need to be fasting. Nothing to eat or drink after midnight- water is ok. If you have labs (blood work) drawn today and your tests are completely normal, you  will receive your results only by: MyChart Message (if you have MyChart) OR A paper copy in the mail If you have any lab test that is abnormal or we need to change your treatment, we will call you to review the results.  Follow-Up: At [facility], you and your health needs are our priority.  As part of our continuing mission to provide you with exceptional heart care, we have created designated Provider Care Teams.  These Care Teams include your primary Cardiologist (physician) and Advanced Practice Providers (APPs -  Physician Assistants and Nurse Practitioners) who all work together to provide you with the care you need, when you need it.  We recommend signing up for the patient portal called "MyChart".  Sign up information is provided on this After Visit Summary.  MyChart is used to connect with patients for Virtual Visits (Telemedicine).  Patients are able to view lab/test results, encounter notes, upcoming appointments, etc.  Non-urgent messages can be sent to your provider as well.   To learn more about what you can do with MyChart, go to [email or web address].    Your next appointment:   1 year(s)  Provider:   Maisie Fus, MD       Signed, Maisie Fus, MD  03/09/2023 4:26 PM    [facility]

## 2023-03-09 NOTE — Patient Instructions (Signed)
Medication Instructions:  No changes *If you need a refill on your cardiac medications before your next appointment, please call your pharmacy*   Lab Work: Fasting Lipid panel- Please return for Blood Work when it is convenient. No appointment needed, lab here at the office is open Monday-Friday from 8AM to 4PM.  You will need to be fasting. Nothing to eat or drink after midnight- water is ok. If you have labs (blood work) drawn today and your tests are completely normal, you will receive your results only by: MyChart Message (if you have MyChart) OR A paper copy in the mail If you have any lab test that is abnormal or we need to change your treatment, we will call you to review the results.  Follow-Up: At [facility], you and your health needs are our priority.  As part of our continuing mission to provide you with exceptional heart care, we have created designated Provider Care Teams.  These Care Teams include your primary Cardiologist (physician) and Advanced Practice Providers (APPs -  Physician Assistants and Nurse Practitioners) who all work together to provide you with the care you need, when you need it.  We recommend signing up for the patient portal called "MyChart".  Sign up information is provided on this After Visit Summary.  MyChart is used to connect with patients for Virtual Visits (Telemedicine).  Patients are able to view lab/test results, encounter notes, upcoming appointments, etc.  Non-urgent messages can be sent to your provider as well.   To learn more about what you can do with MyChart, go to [email or web address].    Your next appointment:   1 year(s)  Provider:   Maisie Fus, MD

## 2023-03-10 ENCOUNTER — Telehealth: Payer: Self-pay

## 2023-03-10 NOTE — Telephone Encounter (Addendum)
Called patient regarding results. Patient had understanding of results.----- Message from Dorris Fetch, CMA sent at 03/01/2023  3:09 PM EDT ----- Regarding: Result Letter Can you please print provider comments in a letter out to patient please and thanks  ----- Message ----- From: Carlos Levering, NP Sent: 02/22/2023   2:19 PM EDT To: Dorris Fetch, CMA  Please let patient know kidney function is normal. Potassium is slightly elevated. Limit foods high in potassium such as potatoes, sweet potatoes, citrus fruits/juices, dried fruits.  Normal blood counts.   Thank you!  DW

## 2023-04-07 ENCOUNTER — Ambulatory Visit
Admission: EM | Admit: 2023-04-07 | Discharge: 2023-04-07 | Disposition: A | Discharge status: Home / Self Care | Payer: MEDICAID | Attending: Family Medicine | Admitting: Family Medicine

## 2023-04-07 DIAGNOSIS — R1084 Generalized abdominal pain: Secondary | ICD-10-CM | POA: Diagnosis not present

## 2023-04-07 DIAGNOSIS — I10 Essential (primary) hypertension: Secondary | ICD-10-CM | POA: Diagnosis not present

## 2023-04-07 DIAGNOSIS — R0789 Other chest pain: Secondary | ICD-10-CM | POA: Diagnosis not present

## 2023-04-07 MED ORDER — DICYCLOMINE HCL 20 MG PO TABS: 20.0000 mg | ORAL_TABLET | Freq: Three times a day (TID) | ORAL | 0 refills | Status: AC

## 2023-04-07 NOTE — Discharge Instructions (Addendum)
Increase the BP medicine to a whole pill Take the bentyl as needed abdominal pain- it calms colon spasm and cramping Call your doctor tomorrow

## 2023-04-07 NOTE — ED Provider Notes (Signed)
[name]    CSN: [id number] Arrival date & time: 04/07/23  1454      History   Chief Complaint Chief Complaint  Patient presents with   Chest Pain   Abdominal Pain    HPI Peter Graham is a 57 y.o. male.   HPI  Patient is a chronic pain patient who goes to the [facility].  He has a history of multiple substance abuse disorders.  He also has chronic pain syndrome.  He is here today complaining of chest pain and abdominal pain.  He had a similar ER visit a few days ago for chest pain and abdominal pain where he was given hydrocodone.  He is also worried about his blood pressure, but is only taking half of his blood pressure pill daily for unknown reasons.  The chest pain is a "pressure" that he feels just stress related.  His EKG is normal.  He has had 2 cardiac workups that were negative The abdominal pain also was diffuse.  He also says its trouble "stress".  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   Opioid dependence, uncomplicated (HCC) 07/10/2021   Substance induced mood disorder (HCC) 07/10/2021   Methamphetamine abuse (HCC) 07/08/2021   Psychosis (HCC) 07/07/2021   Chronic pain syndrome 11/02/2018   Abscess of dorsum of left hand 11/20/2017   Effusion of left knee joint 10/26/2017   Left knee tendonitis 10/25/2017   Anxiety 08/25/2017   Foot pain, bilateral 08/03/2017   MRSA cellulitis 06/09/2014   BPH (benign prostatic hyperplasia) 06/05/2014   Cellulitis of corpus cavernosum and penis 06/05/2014   Penile abscess 06/05/2014   Essential hypertension 06/05/2014    Herpes 06/05/2014    Past Surgical History:  Procedure Laterality Date   HERNIA REPAIR     LEFT HEART CATH AND CORONARY ANGIOGRAPHY N/A 02/23/2023   Procedure: LEFT HEART CATH AND CORONARY ANGIOGRAPHY;  Surgeon: Lennette Bihari, MD;  Location: [facility];  Service: Cardiovascular;  Laterality: N/A;   NASAL SEPTUM SURGERY         Home Medications    Prior to Admission medications   Medication Sig Start Date End Date Taking? Authorizing Provider  dicyclomine (BENTYL) 20 MG tablet Take 1 tablet (20 mg total) by mouth 3 (three) times daily before meals. 04/07/23  Yes Eustace Moore, MD  HYDROcodone-acetaminophen (NORCO/VICODIN) 5-325 MG tablet Take by mouth. 03/24/23 04/08/23 Yes [provider]  naloxone [facility]) nasal spray 4 mg/0.1 mL Place into the nose. 04/02/23 05/02/23 Yes [provider]  testosterone cypionate (DEPOTESTOSTERONE CYPIONATE) 200 MG/ML injection SMARTSIG:0.38 Milliliter(s) IM Every 2 Weeks 03/17/23  Yes [provider]  aspirin EC 81 MG tablet Take 1 tablet (81 mg total) by mouth daily. Swallow whole. 12/29/22   Maisie Fus, MD  atorvastatin (LIPITOR) 20 MG tablet Take 1 tablet (20 mg total) by mouth daily. Patient taking differently: Take 10 mg by mouth daily. 12/29/22 03/29/23  Maisie Fus, MD  cholecalciferol (VITAMIN D3) 25 MCG (1000 UNIT) tablet Take 1,000 Units by mouth daily. Patient not taking: Reported on 03/09/2023  [provider]  clonazePAM (KLONOPIN) 1 MG tablet Take 1 mg by mouth 3 (three) times daily.    [provider]  doxycycline (VIBRAMYCIN) 100 MG capsule Take by mouth. 03/31/23 04/07/23  [provider]  gabapentin (NEURONTIN) 300 MG capsule Take 600 mg by mouth 3 (three) times daily. 06/16/21   [provider]  metoprolol succinate (TOPROL-XL) 50 MG 24 hr tablet Take 1 tablet (50 mg total) by mouth daily. Take with or immediately following a meal. Patient taking differently: Take 25  mg by mouth daily. Take with or immediately following a meal. 02/01/23 05/02/23  Maisie Fus, MD  tadalafil (CIALIS) 5 MG tablet Take 5 mg by mouth daily as needed for erectile dysfunction. 12/08/22   [provider]  Vitamin D, Ergocalciferol, (DRISDOL) 1.25 MG (50000 UNIT) CAPS capsule Take 50,000 Units by mouth once a week. 02/20/23   [provider]    Family History Family History  Problem Relation Age of Onset   Stroke Father    Diabetes Maternal Grandmother    Diabetes Maternal Grandfather    Diabetes Paternal Grandmother     Social History Social History   Tobacco Use   Smoking status: Former    Current packs/day: 0.00    Average packs/day: 1 pack/day for 20.0 years (20.0 ttl pk-yrs)    Types: Cigarettes    Start date: 08/31/2001    Quit date: 08/31/2021    Years since quitting: 1.6  Substance Use Topics   Alcohol use: No   Drug use: Not Currently    Types: IV     Allergies   Patient has no known allergies.   Review of Systems Review of Systems See HPI  Physical Exam Triage Vital Signs ED Triage Vitals  Encounter Vitals Group     BP 04/07/23 1503 (!) 171/92     Systolic BP Percentile --      Diastolic BP Percentile --      Pulse Rate 04/07/23 1503 83     Resp 04/07/23 1503 18     Temp 04/07/23 1503 98.2 F (36.8 C)     Temp Source 04/07/23 1503 Oral     SpO2 04/07/23 1503 97 %     Weight --      Height --      Head Circumference --      Peak Flow --      Pain Score 04/07/23 1507 5     Pain Loc --      Pain Education --      Exclude from Growth Chart --    No data found.  Updated Vital Signs BP (!) 171/92 (BP Location: Right Arm)   Pulse 83   Temp 98.2 F (36.8 C) (Oral)   Resp 18   SpO2 97%      Physical Exam Constitutional:      General: He is not in acute distress.    Appearance: He is well-developed. He is ill-appearing.     Comments: Appears tired  HENT:     Head: Normocephalic and atraumatic.  Eyes:      Conjunctiva/sclera: Conjunctivae normal.     Pupils: Pupils are equal, round, and reactive to light.  Cardiovascular:     Rate and Rhythm: Normal rate and regular rhythm.     Heart sounds: Normal heart sounds. No murmur heard. Pulmonary:     Effort: Pulmonary effort is normal. No respiratory distress.     Breath sounds: Normal breath sounds.  Abdominal:     General: Bowel sounds are normal. There is no distension.     Palpations: Abdomen is soft. There is no hepatomegaly or splenomegaly.     Tenderness: There is no abdominal tenderness.  Musculoskeletal:        General: Normal range of motion.     Cervical back: Normal range of motion.  Skin:    General: Skin is warm and dry.  Neurological:     Mental Status: He is alert.      UC Treatments / Results  Labs (all labs ordered are listed, but only abnormal results are displayed) Labs Reviewed - No data to display  EKG   Radiology No results found.  Procedures Procedures (including critical care time)  Medications Ordered in UC Medications - No data to display  Initial Impression / Assessment and Plan / UC Course  I have reviewed the triage vital signs and the nursing notes.  Pertinent labs & imaging results that were available during my care of the patient were reviewed by me and considered in my medical decision making (see chart for details).     I checked the patient's blood pressure with a manual cuff twice during his office visit.  First time it was 168/98.  Second it was 158/90.  I told him these are elevated but not dangerous and he needs to follow-up with his primary care doctor.  He also should be taking his full blood pressure medication and not just a half Regarding his chest pain I agree that it probably is stress related.  It is noncardiac.  His EKG here is normal Regarding his abdominal pain, this is also likely stress related.  He does not have any red flag symptoms.  This should be followed up with his  primary care doctor.   I did inform him that it is not appropriate for me to give him any pain medication Final Clinical Impressions(s) / UC Diagnoses   Final diagnoses:  Atypical chest pain  Hypertension, unspecified type  Generalized abdominal pain     Discharge Instructions      Increase the BP medicine to a whole pill Take the bentyl as needed abdominal pain- it calms colon spasm and cramping Call your doctor tomorrow   ED Prescriptions     Medication Sig Dispense Auth. Provider   dicyclomine (BENTYL) 20 MG tablet Take 1 tablet (20 mg total) by mouth 3 (three) times daily before meals. 20 tablet Eustace Moore, MD      I have reviewed the PDMP during this encounter.   Eustace Moore, MD 04/07/23 [phone]

## 2023-04-07 NOTE — ED Triage Notes (Signed)
Pt reports middle chest pressure, low abdominal pain and shortness of breath x 2-3 days. Shortness of breath improved with resting. Reports he is under a lot of stress with his wife and his is scheduled for hip surgery.

## 2024-04-12 ENCOUNTER — Other Ambulatory Visit: Payer: Self-pay

## 2024-04-12 DIAGNOSIS — I25119 Atherosclerotic heart disease of native coronary artery with unspecified angina pectoris: Secondary | ICD-10-CM

## 2024-04-12 MED ORDER — METOPROLOL SUCCINATE ER 50 MG PO TB24: 50.0000 mg | ORAL_TABLET | Freq: Every day | ORAL | 0 refills | Status: DC

## 2024-05-29 ENCOUNTER — Emergency Department (HOSPITAL_COMMUNITY)
Admission: EM | Admit: 2024-05-29 | Discharge: 2024-05-29 | Disposition: A | Discharge status: Home / Self Care | Payer: MEDICAID | Attending: Emergency Medicine | Admitting: Emergency Medicine

## 2024-05-29 ENCOUNTER — Encounter (HOSPITAL_COMMUNITY): Payer: Self-pay

## 2024-05-29 ENCOUNTER — Emergency Department (HOSPITAL_COMMUNITY): Payer: MEDICAID

## 2024-05-29 DIAGNOSIS — Z87891 Personal history of nicotine dependence: Secondary | ICD-10-CM | POA: Diagnosis not present

## 2024-05-29 DIAGNOSIS — Z7982 Long term (current) use of aspirin: Secondary | ICD-10-CM | POA: Diagnosis not present

## 2024-05-29 DIAGNOSIS — I10 Essential (primary) hypertension: Secondary | ICD-10-CM | POA: Insufficient documentation

## 2024-05-29 DIAGNOSIS — N401 Enlarged prostate with lower urinary tract symptoms: Secondary | ICD-10-CM

## 2024-05-29 DIAGNOSIS — Z79899 Other long term (current) drug therapy: Secondary | ICD-10-CM | POA: Diagnosis not present

## 2024-05-29 DIAGNOSIS — R519 Headache, unspecified: Secondary | ICD-10-CM | POA: Diagnosis not present

## 2024-05-29 DIAGNOSIS — R300 Dysuria: Secondary | ICD-10-CM | POA: Diagnosis present

## 2024-05-29 LAB — CBC WITH DIFFERENTIAL/PLATELET
Abs Immature Granulocytes: 0.01 K/uL (ref 0.00–0.07)
Basophils Absolute: 0.1 K/uL (ref 0.0–0.1)
Basophils Relative: 1 %
Eosinophils Absolute: 0.3 K/uL (ref 0.0–0.5)
Eosinophils Relative: 5 %
HCT: 39.1 % (ref 39.0–52.0)
Hemoglobin: 13.2 g/dL (ref 13.0–17.0)
Immature Granulocytes: 0 %
Lymphocytes Relative: 30 %
Lymphs Abs: 1.7 K/uL (ref 0.7–4.0)
MCH: 32 pg (ref 26.0–34.0)
MCHC: 33.8 g/dL (ref 30.0–36.0)
MCV: 94.7 fL (ref 80.0–100.0)
Monocytes Absolute: 0.5 K/uL (ref 0.1–1.0)
Monocytes Relative: 9 %
Neutro Abs: 3.1 K/uL (ref 1.7–7.7)
Neutrophils Relative %: 55 %
Platelets: 159 K/uL (ref 150–400)
RBC: 4.13 MIL/uL — ABNORMAL LOW (ref 4.22–5.81)
RDW: 12.2 % (ref 11.5–15.5)
WBC: 5.6 K/uL (ref 4.0–10.5)
nRBC: 0 % (ref 0.0–0.2)

## 2024-05-29 LAB — URINALYSIS, ROUTINE W REFLEX MICROSCOPIC
Bilirubin Urine: NEGATIVE
Glucose, UA: NEGATIVE mg/dL
Hgb urine dipstick: NEGATIVE
Ketones, ur: NEGATIVE mg/dL
Leukocytes,Ua: NEGATIVE
Nitrite: NEGATIVE
Protein, ur: NEGATIVE mg/dL
Specific Gravity, Urine: 1.03 (ref 1.005–1.030)
pH: 5 (ref 5.0–8.0)

## 2024-05-29 LAB — COMPREHENSIVE METABOLIC PANEL WITH GFR
ALT: 24 U/L (ref 0–44)
AST: 29 U/L (ref 15–41)
Albumin: 4.6 g/dL (ref 3.5–5.0)
Alkaline Phosphatase: 67 U/L (ref 38–126)
Anion gap: 12 (ref 5–15)
BUN: 13 mg/dL (ref 6–20)
CO2: 25 mmol/L (ref 22–32)
Calcium: 9.3 mg/dL (ref 8.9–10.3)
Chloride: 103 mmol/L (ref 98–111)
Creatinine, Ser: 0.76 mg/dL (ref 0.61–1.24)
GFR, Estimated: >60 mL/min (ref >60)
Glucose, Bld: 126 mg/dL — ABNORMAL HIGH (ref 70–99)
Potassium: 4.1 mmol/L (ref 3.5–5.1)
Sodium: 139 mmol/L (ref 135–145)
Total Bilirubin: 0.5 mg/dL (ref 0.0–1.2)
Total Protein: 6.3 g/dL — ABNORMAL LOW (ref 6.5–8.1)

## 2024-05-29 MED ORDER — IOHEXOL 300 MG/ML  SOLN
100.0000 mL | Freq: Once | INTRAMUSCULAR | Status: AC | PRN
  Administered 2024-05-29: 100 mL via INTRAVENOUS

## 2024-05-29 NOTE — Discharge Instructions (Addendum)
 While you were in the emergency room, you had blood work done that was normal.  Your urine did not show any signs of infection.  Your head CT was normal.  There was a small lesion that was noted on your right kidney.  Most often these are benign, nothing to worry about, but it is recommended that you follow-up with your primary care doctor for a repeat picture within 6 months to 1 year.  Included your discharge paperwork is a telephone number for a urologist.  You may call them to make a follow-up appointment.

## 2024-05-29 NOTE — ED Triage Notes (Signed)
 Pt presents with c/o pain in his prostate. Pt reports he feels pressure and burning with urination but that he does have some difficulty initiating a urine stream. Pt reports that he does sometimes have to self cath to be able to urinate. Pt is following with a doc but is unable to get care until several months into the future.

## 2024-05-29 NOTE — ED Provider Notes (Signed)
 [place] EMERGENCY DEPARTMENT AT [facility] Provider Note  CSN: [id number] Arrival date & time: 05/29/24 1408  Chief Complaint(s) Prostate Pain  HPI Peter Graham is a 58 y.o. male who is here today due to difficulty urinating.  Patient has a history of BPH, states that over the last several months, he has had more difficult time urinating.  He has followed with a urologist within the last 1 week, was told that he would not be able to be scheduled for procedure for more than 1 month.  He is here today because he is hoping to have some intervention performed regarding his prostate.  He also endorses 6 months of headache, states that he has had multiple concussions previously.  He says that he had an MRI 4 years ago, was told that he needed to have a follow-up MRI in 1 year.  He was requesting MRI today.   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   Opioid dependence, uncomplicated (HCC) 07/10/2021   Substance induced mood disorder (HCC) 07/10/2021   Methamphetamine abuse (HCC) 07/08/2021   Psychosis (HCC) 07/07/2021   Chronic pain syndrome 11/02/2018   Abscess of dorsum of left hand 11/20/2017   Effusion of left knee joint 10/26/2017   Left knee tendonitis 10/25/2017   Anxiety 08/25/2017   Foot pain, bilateral 08/03/2017   MRSA cellulitis 06/09/2014   BPH (benign prostatic hyperplasia) 06/05/2014   Cellulitis of corpus cavernosum and penis 06/05/2014   Penile abscess 06/05/2014   Essential hypertension 06/05/2014   Herpes 06/05/2014   Home Medication(s) Prior to Admission medications   Medication Sig Start Date End  Date Taking? Authorizing Provider  aspirin  EC 81 MG tablet Take 1 tablet (81 mg total) by mouth daily. Swallow whole. 12/29/22   Alvan Ronal BRAVO, MD  atorvastatin  (LIPITOR) 20 MG tablet Take 1 tablet (20 mg total) by mouth daily. Patient taking differently: Take 10 mg by mouth daily. 12/29/22 03/29/23  Alvan Ronal BRAVO, MD  cholecalciferol (VITAMIN D3) 25 MCG (1000 UNIT) tablet Take 1,000 Units by mouth daily. Patient not taking: Reported on 03/09/2023    [provider]  clonazePAM (KLONOPIN) 1 MG tablet Take 1 mg by mouth 3 (three) times daily.    [provider]  dicyclomine  (BENTYL ) 20 MG tablet Take 1 tablet (20 mg total) by mouth 3 (three) times daily before meals. 04/07/23   Maranda Jamee Jacob, MD  gabapentin (NEURONTIN) 300 MG capsule Take 600 mg by mouth 3 (three) times daily. 06/16/21   [provider]  metoprolol  succinate (TOPROL -XL) 50 MG 24 hr tablet Take 1 tablet (50 mg total) by mouth daily. Take with or immediately following a meal. 04/12/24 07/11/24  Loistine Sober, NP  tadalafil (CIALIS) 5 MG tablet Take 5 mg by mouth daily as needed for erectile dysfunction. 12/08/22   [provider]  testosterone cypionate (DEPOTESTOSTERONE CYPIONATE) 200 MG/ML injection SMARTSIG:0.38 Milliliter(s) IM Every 2 Weeks 03/17/23   [provider]  Vitamin D, Ergocalciferol, (DRISDOL) 1.25 MG (50000 UNIT) CAPS capsule Take 50,000 Units by mouth once a week. 02/20/23   [provider]  Past Surgical History Past Surgical History:  Procedure Laterality Date   HERNIA REPAIR     LEFT HEART CATH AND CORONARY ANGIOGRAPHY N/A 02/23/2023   Procedure: LEFT HEART CATH AND CORONARY ANGIOGRAPHY;  Surgeon: Burnard Debby LABOR, MD;  Location: [facility];  Service: Cardiovascular;  Laterality: N/A;   NASAL SEPTUM SURGERY     Family  History Family History  Problem Relation Age of Onset   Stroke Father    Diabetes Maternal Grandmother    Diabetes Maternal Grandfather    Diabetes Paternal Grandmother     Social History Social History   Tobacco Use   Smoking status: Former    Current packs/day: 0.00    Average packs/day: 1 pack/day for 20.0 years (20.0 ttl pk-yrs)    Types: Cigarettes    Start date: 08/31/2001    Quit date: 08/31/2021    Years since quitting: 2.7  Substance Use Topics   Alcohol use: No   Drug use: Not Currently    Types: IV   Allergies Patient has no known allergies.  Review of Systems Review of Systems  Physical Exam Vital Signs  I have reviewed the triage vital signs BP (!) 160/114 (BP Location: Left Arm)   Pulse (!) 58   Temp 98.2 F (36.8 C) (Oral)   Resp 18   SpO2 100%   Physical Exam Vitals and nursing note reviewed.  Constitutional:      General: He is not in acute distress.    Appearance: He is not toxic-appearing.  HENT:     Head: Normocephalic.     Nose: Nose normal.  Eyes:     Pupils: Pupils are equal, round, and reactive to light.  Cardiovascular:     Rate and Rhythm: Normal rate.  Pulmonary:     Effort: Pulmonary effort is normal.  Abdominal:     General: Abdomen is flat.     Hernia: No hernia is present.  Musculoskeletal:        General: No swelling or deformity. Normal range of motion.     Cervical back: Normal range of motion.  Skin:    General: Skin is warm.  Neurological:     General: No focal deficit present.     Mental Status: He is alert and oriented to person, place, and time.     ED Results and Treatments Labs (all labs ordered are listed, but only abnormal results are displayed) Labs Reviewed  CBC WITH DIFFERENTIAL/PLATELET  COMPREHENSIVE METABOLIC PANEL WITH GFR  URINALYSIS, ROUTINE W REFLEX MICROSCOPIC                                                                                                                           Radiology No results found.  Pertinent labs & imaging results that were available during my care of the patient were reviewed by me and considered in my medical decision making (see MDM for details).  Medications Ordered in ED Medications - No data  to display                                                                                                                                   Procedures Procedures  (including critical care time)  Medical Decision Making / ED Course   This patient presents to the ED for concern of difficulty urinating and headache, this involves an extensive number of treatment options, and is a complaint that carries with it a high risk of complications and morbidity.  The differential diagnosis includes BPH, less likely prostatitis, chronic headache, consider mass, less likely ICH.  Consider UTI.  MDM: Patient is been evaluated by urology, is known to have BPH.  He has no infectious symptoms to suggest prostatitis.  Rectal exam deferred given recent examination by urology and no change in symptoms.  Will obtain imaging of the patient's abdomen pelvis as he is endorsing some pain in his left lower quadrant.  Informed the patient that we would not build to get an MRI today given his absence of emergent neurological compromise due to his 6 months of headache.  Will obtain CT imaging.  Basic blood work ordered.  Reassessment 6:25 PM-patient CT head, per my independent review, shows no intracranial hemorrhage.  Patient CT abdomen pelvis negative for any acute process.  He has an incidental right kidney lesion.  I discussed this with the patient.  He will follow-up with his PCP regarding this.  Discharged with urology follow-up.  Patient's renal function normal, urinalysis negative for infection.   Additional history obtained:  -External records from outside source obtained and reviewed including: Chart review including previous notes, labs, imaging,  consultation notes   Lab Tests: -I ordered, reviewed, and interpreted labs.   The pertinent results include:   Labs Reviewed  CBC WITH DIFFERENTIAL/PLATELET  COMPREHENSIVE METABOLIC PANEL WITH GFR  URINALYSIS, ROUTINE W REFLEX MICROSCOPIC     Imaging Studies ordered: I ordered imaging studies including CT on pelvis, CT I independently visualized and interpreted imaging. I agree with the radiologist interpretation   Medicines ordered and prescription drug management: No orders of the defined types were placed in this encounter.   -I have reviewed the patients home medicines and have made adjustments as needed   Cardiac Monitoring: The patient was maintained on a cardiac monitor.  I personally viewed and interpreted the cardiac monitored which showed an underlying rhythm of: Normal sinus rhythm  Social Determinants of Health:  Factors impacting patients care include: Lack of access to primary care   Reevaluation: After the interventions noted above, I reevaluated the patient and found that they have :improved  Co morbidities that complicate the patient evaluation  Past Medical History:  Diagnosis Date   Hypertension       Dispostion: I considered admission for this patient, however he is appropriate for discharge.     Final Clinical Impression(s) / ED Diagnoses Final diagnoses:  None     @  [facility]    Mannie Pac T, DO 05/29/24 RONOLD

## 2024-08-07 ENCOUNTER — Other Ambulatory Visit: Payer: Self-pay

## 2024-08-07 DIAGNOSIS — I25119 Atherosclerotic heart disease of native coronary artery with unspecified angina pectoris: Secondary | ICD-10-CM

## 2024-08-10 MED ORDER — METOPROLOL SUCCINATE ER 50 MG PO TB24: 50.0000 mg | ORAL_TABLET | Freq: Every day | ORAL | 0 refills | Status: AC
